# Patient Record
Sex: Female | Born: 1970 | Hispanic: No | Marital: Married | Smoking: Never smoker
Health system: Southern US, Community
[De-identification: ages and names within clinical notes are randomized; demographics above are authoritative.]

## PROBLEM LIST (undated history)

## (undated) HISTORY — PX: OTHER SURGICAL HISTORY: SHX169

---

## 1998-02-17 ENCOUNTER — Encounter: Admission: RE | Admit: 1998-02-17 | Discharge: 1998-02-17 | Payer: Self-pay | Admitting: Obstetrics

## 1998-04-15 ENCOUNTER — Ambulatory Visit (HOSPITAL_COMMUNITY): Admission: RE | Admit: 1998-04-15 | Discharge: 1998-04-15 | Payer: Self-pay | Admitting: Obstetrics & Gynecology

## 1998-04-15 ENCOUNTER — Encounter: Admission: RE | Admit: 1998-04-15 | Discharge: 1998-04-15 | Payer: Self-pay | Admitting: Obstetrics & Gynecology

## 1998-04-15 ENCOUNTER — Other Ambulatory Visit: Admission: RE | Admit: 1998-04-15 | Discharge: 1998-04-15 | Payer: Self-pay | Admitting: Obstetrics & Gynecology

## 1998-04-29 ENCOUNTER — Encounter: Admission: RE | Admit: 1998-04-29 | Discharge: 1998-04-29 | Payer: Self-pay | Admitting: Obstetrics & Gynecology

## 1998-11-03 ENCOUNTER — Ambulatory Visit (HOSPITAL_COMMUNITY): Admission: RE | Admit: 1998-11-03 | Discharge: 1998-11-03 | Payer: Self-pay | Admitting: Obstetrics

## 1999-03-06 ENCOUNTER — Inpatient Hospital Stay (HOSPITAL_COMMUNITY): Admission: AD | Admit: 1999-03-06 | Discharge: 1999-03-08 | Payer: Self-pay | Admitting: *Deleted

## 2000-08-25 ENCOUNTER — Emergency Department (HOSPITAL_COMMUNITY): Admission: EM | Admit: 2000-08-25 | Discharge: 2000-08-26 | Payer: Self-pay | Admitting: Emergency Medicine

## 2000-08-26 ENCOUNTER — Encounter: Payer: Self-pay | Admitting: Emergency Medicine

## 2000-11-11 ENCOUNTER — Emergency Department (HOSPITAL_COMMUNITY): Admission: EM | Admit: 2000-11-11 | Discharge: 2000-11-11 | Payer: Self-pay | Admitting: Emergency Medicine

## 2001-01-21 ENCOUNTER — Ambulatory Visit (HOSPITAL_COMMUNITY): Admission: RE | Admit: 2001-01-21 | Discharge: 2001-01-21 | Payer: Self-pay | Admitting: *Deleted

## 2001-02-26 ENCOUNTER — Inpatient Hospital Stay (HOSPITAL_COMMUNITY): Admission: AD | Admit: 2001-02-26 | Discharge: 2001-02-26 | Payer: Self-pay | Admitting: Obstetrics

## 2001-02-28 ENCOUNTER — Inpatient Hospital Stay (HOSPITAL_COMMUNITY): Admission: AD | Admit: 2001-02-28 | Discharge: 2001-02-28 | Payer: Self-pay | Admitting: *Deleted

## 2001-04-12 ENCOUNTER — Inpatient Hospital Stay (HOSPITAL_COMMUNITY): Admission: AD | Admit: 2001-04-12 | Discharge: 2001-04-14 | Payer: Self-pay | Admitting: Obstetrics

## 2001-07-21 ENCOUNTER — Emergency Department (HOSPITAL_COMMUNITY): Admission: EM | Admit: 2001-07-21 | Discharge: 2001-07-21 | Payer: Self-pay | Admitting: Emergency Medicine

## 2001-07-28 ENCOUNTER — Inpatient Hospital Stay (HOSPITAL_COMMUNITY): Admission: AD | Admit: 2001-07-28 | Discharge: 2001-07-28 | Payer: Self-pay | Admitting: Obstetrics

## 2001-09-09 ENCOUNTER — Emergency Department (HOSPITAL_COMMUNITY): Admission: EM | Admit: 2001-09-09 | Discharge: 2001-09-09 | Payer: Self-pay | Admitting: Emergency Medicine

## 2001-09-10 ENCOUNTER — Encounter: Payer: Self-pay | Admitting: Emergency Medicine

## 2002-12-20 ENCOUNTER — Emergency Department (HOSPITAL_COMMUNITY): Admission: EM | Admit: 2002-12-20 | Discharge: 2002-12-20 | Payer: Self-pay | Admitting: Unknown Physician Specialty

## 2013-07-27 ENCOUNTER — Emergency Department (HOSPITAL_COMMUNITY)
Admission: EM | Admit: 2013-07-27 | Discharge: 2013-07-27 | Disposition: A | Discharge status: Home / Self Care | Payer: Self-pay | Attending: Emergency Medicine | Admitting: Emergency Medicine

## 2013-07-27 ENCOUNTER — Encounter (HOSPITAL_COMMUNITY): Payer: Self-pay | Admitting: Emergency Medicine

## 2013-07-27 DIAGNOSIS — R7401 Elevation of levels of liver transaminase levels: Secondary | ICD-10-CM | POA: Insufficient documentation

## 2013-07-27 DIAGNOSIS — R7402 Elevation of levels of lactic acid dehydrogenase (LDH): Secondary | ICD-10-CM | POA: Insufficient documentation

## 2013-07-27 DIAGNOSIS — R519 Headache, unspecified: Secondary | ICD-10-CM

## 2013-07-27 DIAGNOSIS — R740 Nonspecific elevation of levels of transaminase and lactic acid dehydrogenase [LDH]: Secondary | ICD-10-CM

## 2013-07-27 DIAGNOSIS — R42 Dizziness and giddiness: Secondary | ICD-10-CM | POA: Insufficient documentation

## 2013-07-27 DIAGNOSIS — D649 Anemia, unspecified: Secondary | ICD-10-CM | POA: Insufficient documentation

## 2013-07-27 DIAGNOSIS — R1110 Vomiting, unspecified: Secondary | ICD-10-CM | POA: Insufficient documentation

## 2013-07-27 DIAGNOSIS — H81399 Other peripheral vertigo, unspecified ear: Secondary | ICD-10-CM

## 2013-07-27 DIAGNOSIS — R7309 Other abnormal glucose: Secondary | ICD-10-CM | POA: Insufficient documentation

## 2013-07-27 DIAGNOSIS — R51 Headache: Secondary | ICD-10-CM | POA: Insufficient documentation

## 2013-07-27 DIAGNOSIS — R739 Hyperglycemia, unspecified: Secondary | ICD-10-CM

## 2013-07-27 LAB — CBC WITH DIFFERENTIAL/PLATELET
Basophils Absolute: 0 10*3/uL (ref 0.0–0.1)
Basophils Relative: 0 % (ref 0–1)
Eosinophils Absolute: 0 10*3/uL (ref 0.0–0.7)
Eosinophils Relative: 0 % (ref 0–5)
HCT: 31.9 % — ABNORMAL LOW (ref 36.0–46.0)
HEMOGLOBIN: 10.7 g/dL — AB (ref 12.0–15.0)
LYMPHS ABS: 1.3 10*3/uL (ref 0.7–4.0)
LYMPHS PCT: 23 % (ref 12–46)
MCH: 27.9 pg (ref 26.0–34.0)
MCHC: 33.5 g/dL (ref 30.0–36.0)
MCV: 83.1 fL (ref 78.0–100.0)
MONOS PCT: 3 % (ref 3–12)
Monocytes Absolute: 0.2 10*3/uL (ref 0.1–1.0)
NEUTROS ABS: 4.2 10*3/uL (ref 1.7–7.7)
NEUTROS PCT: 74 % (ref 43–77)
PLATELETS: 218 10*3/uL (ref 150–400)
RBC: 3.84 MIL/uL — AB (ref 3.87–5.11)
RDW: 14.1 % (ref 11.5–15.5)
WBC: 5.7 10*3/uL (ref 4.0–10.5)

## 2013-07-27 LAB — COMPREHENSIVE METABOLIC PANEL
ALT: 55 U/L — AB (ref 0–35)
AST: 63 U/L — ABNORMAL HIGH (ref 0–37)
Albumin: 3.6 g/dL (ref 3.5–5.2)
Alkaline Phosphatase: 44 U/L (ref 39–117)
BUN: 15 mg/dL (ref 6–23)
CHLORIDE: 101 meq/L (ref 96–112)
CO2: 26 mEq/L (ref 19–32)
Calcium: 8.6 mg/dL (ref 8.4–10.5)
Creatinine, Ser: 0.73 mg/dL (ref 0.50–1.10)
GFR calc Af Amer: >90 mL/min (ref >90)
GFR calc non Af Amer: >90 mL/min (ref >90)
GLUCOSE: 145 mg/dL — AB (ref 70–99)
POTASSIUM: 4 meq/L (ref 3.7–5.3)
Sodium: 139 mEq/L (ref 137–147)
TOTAL PROTEIN: 7.5 g/dL (ref 6.0–8.3)

## 2013-07-27 LAB — URINALYSIS, ROUTINE W REFLEX MICROSCOPIC
Bilirubin Urine: NEGATIVE
Glucose, UA: NEGATIVE mg/dL
Hgb urine dipstick: NEGATIVE
Ketones, ur: NEGATIVE mg/dL
LEUKOCYTES UA: NEGATIVE
NITRITE: NEGATIVE
PH: 6 (ref 5.0–8.0)
Protein, ur: NEGATIVE mg/dL
SPECIFIC GRAVITY, URINE: 1.017 (ref 1.005–1.030)
UROBILINOGEN UA: 0.2 mg/dL (ref 0.0–1.0)

## 2013-07-27 MED ORDER — ONDANSETRON HCL 4 MG/2ML IJ SOLN
4.0000 mg | Freq: Once | INTRAMUSCULAR | Status: AC
  Administered 2013-07-27: 4 mg via INTRAVENOUS
  Filled 2013-07-27: qty 2

## 2013-07-27 MED ORDER — MECLIZINE HCL 25 MG PO TABS
25.0000 mg | ORAL_TABLET | Freq: Once | ORAL | Status: AC
  Administered 2013-07-27: 25 mg via ORAL
  Filled 2013-07-27: qty 1

## 2013-07-27 MED ORDER — METOCLOPRAMIDE HCL 10 MG PO TABS: 10.0000 mg | ORAL_TABLET | Freq: Four times a day (QID) | ORAL | Status: DC | PRN

## 2013-07-27 MED ORDER — MECLIZINE HCL 25 MG PO TABS: 25.0000 mg | ORAL_TABLET | Freq: Three times a day (TID) | ORAL | Status: DC | PRN

## 2013-07-27 NOTE — ED Provider Notes (Signed)
CSN: [id number]     Arrival date & time 07/27/13  0051 History   First MD Initiated Contact with Patient 07/27/13 0421     Chief Complaint  Patient presents with  . Emesis  . Dizziness   (Consider location/radiation/quality/duration/timing/severity/associated sxs/prior Treatment) Patient is a 43 y.o. female presenting with vomiting and dizziness. The history is provided by the patient.  Emesis Dizziness Associated symptoms: vomiting   She had onset at 11:30 PM of vertigo with associated nausea and vomiting. Vertigo was worse when she moves her head such as turning over in bed. She denies fever, chills, sweats. She has noted some mild tinnitus but no hearing loss. She was treated for influenza last week and did receive a five-day course of Tamiflu. The symptoms are generally improved. She's never had symptoms like this before.  History reviewed. No pertinent past medical history. History reviewed. No pertinent past surgical history. No family history on file. History  Substance Use Topics  . Smoking status: Never Smoker   . Smokeless tobacco: Not on file  . Alcohol Use: No   OB History   Grav Para Term Preterm Abortions TAB SAB Ect Mult Living                 Review of Systems  Gastrointestinal: Positive for vomiting.  Neurological: Positive for dizziness.  All other systems reviewed and are negative.    Allergies  Review of patient's allergies indicates no known allergies.  Home Medications  No current outpatient prescriptions on file. BP 101/63  Pulse 62  Temp(Src) 97.7 F (36.5 C) (Oral)  Resp 22  SpO2 99%  LMP 07/20/2013 Physical Exam  Nursing note and vitals reviewed.  43 year old female, resting comfortably and in no acute distress. Vital signs are significant for bradycardia with heart rate 54, and tachypnea with respiratory rate of 22. Oxygen saturation is 98%, which is normal. Head is normocephalic and atraumatic. PERRLA, EOMI. Oropharynx is clear. No  nystagmus is seen. Neck is nontender and supple without adenopathy or JVD. Back is nontender and there is no CVA tenderness. Lungs are clear without rales, wheezes, or rhonchi. Chest is nontender. Heart has regular rate and rhythm without murmur. Abdomen is soft, flat, nontender without masses or hepatosplenomegaly and peristalsis is normoactive. Extremities have no cyanosis or edema, full range of motion is present. Skin is warm and dry without rash. Neurologic: Mental status is normal, cranial nerves are intact, there are no motor or sensory deficits. Dizziness is reproduced by head movement.  ED Course  Procedures (including critical care time) Labs Review Results for orders placed during the hospital encounter of 07/27/13  URINALYSIS, ROUTINE W REFLEX MICROSCOPIC      Result Value Range   Color, Urine YELLOW  YELLOW   APPearance HAZY (*) CLEAR   Specific Gravity, Urine 1.017  1.005 - 1.030   pH 6.0  5.0 - 8.0   Glucose, UA NEGATIVE  NEGATIVE mg/dL   Hgb urine dipstick NEGATIVE  NEGATIVE   Bilirubin Urine NEGATIVE  NEGATIVE   Ketones, ur NEGATIVE  NEGATIVE mg/dL   Protein, ur NEGATIVE  NEGATIVE mg/dL   Urobilinogen, UA 0.2  0.0 - 1.0 mg/dL   Nitrite NEGATIVE  NEGATIVE   Leukocytes, UA NEGATIVE  NEGATIVE  CBC WITH DIFFERENTIAL      Result Value Range   WBC 5.7  4.0 - 10.5 K/uL   RBC 3.84 (*) 3.87 - 5.11 MIL/uL   Hemoglobin 10.7 (*) 12.0 - 15.0 g/dL  HCT 31.9 (*) 36.0 - 46.0 %   MCV 83.1  78.0 - 100.0 fL   MCH 27.9  26.0 - 34.0 pg   MCHC 33.5  30.0 - 36.0 g/dL   RDW 40.9  81.1 - 91.4 %   Platelets 218  150 - 400 K/uL   Neutrophils Relative % 74  43 - 77 %   Neutro Abs 4.2  1.7 - 7.7 K/uL   Lymphocytes Relative 23  12 - 46 %   Lymphs Abs 1.3  0.7 - 4.0 K/uL   Monocytes Relative 3  3 - 12 %   Monocytes Absolute 0.2  0.1 - 1.0 K/uL   Eosinophils Relative 0  0 - 5 %   Eosinophils Absolute 0.0  0.0 - 0.7 K/uL   Basophils Relative 0  0 - 1 %   Basophils Absolute 0.0  0.0  - 0.1 K/uL  COMPREHENSIVE METABOLIC PANEL      Result Value Range   Sodium 139  137 - 147 mEq/L   Potassium 4.0  3.7 - 5.3 mEq/L   Chloride 101  96 - 112 mEq/L   CO2 26  19 - 32 mEq/L   Glucose, Bld 145 (*) 70 - 99 mg/dL   BUN 15  6 - 23 mg/dL   Creatinine, Ser 7.82  0.50 - 1.10 mg/dL   Calcium 8.6  8.4 - 95.6 mg/dL   Total Protein 7.5  6.0 - 8.3 g/dL   Albumin 3.6  3.5 - 5.2 g/dL   AST 63 (*) 0 - 37 U/L   ALT 55 (*) 0 - 35 U/L   Alkaline Phosphatase 44  39 - 117 U/L   Total Bilirubin <0.2 (*) 0.3 - 1.2 mg/dL   GFR calc non Af Amer >90  >90 mL/min   GFR calc Af Amer >90  >90 mL/min   MDM   1. Peripheral vertigo, unspecified laterality   2. Anemia   3. Elevated transaminase level   4. Hyperglycemia   5. Headache    Acute vertigo. She is given a dose of ondansetron with improvement in nausea she'll be given a dose of meclizine. Laboratory workup is significant for mild anemia, and mild elevation of transaminases. Blood sugar is also borderline elevated at 145. These will need to be followed as an outpatient.   She feels much better after above noted treatment. She is discharged with prescriptions for meclizine and metoclopramide. She is advised to have blood sugar and AST rechecked as an outpatient.   Dione Booze, MD 07/27/13 [phone]

## 2013-07-27 NOTE — ED Notes (Signed)
Pt. reports dizziness with nausea / vomitting , body aches / chills  onset this evening , denies  diarrhea ,  No fever or cough .

## 2013-07-27 NOTE — Discharge Instructions (Signed)
Su azcar en la sangre era alto hoy. Eso podra ser una seal temprana de la diabetes. Debes revisar peridicamente su azcar en la sangre.  Algunos anlisis de sangre de hgado eran altos hoy - AST y ALT. Esto deben ser revisados en 1-2 semanas.  Vrtigo (Vertigo)  Vrtigo es la sensacin de que se est moviendo estando quieto. Puede ser peligroso si ocurre cuando est trabajado, conduciendo vehculos o realizando actividades difciles.  CAUSAS  El vrtigo se produce cuando hay un conflicto en las seales que se envan al cerebro desde los sistemas visual y sensorial del cuerpo. Hay numerosas causas que [employer], entre las que se incluyen:   Infecciones, especialmente en el odo interno.  Nelia Shi reaccin a un medicamento o mal uso de alcohol y frmacos.  Abstinencia de drogas o alcohol.  Cambios rpidos de posicin, como al [employer] o darse vuelta en la cama.  Dolor de Surveyor, minerals.  Disminucin del flujo sanguneo hacia el cerebro.  Aumento de la presin en el cerebro por un traumatismo, infeccin, tumor o sangrado en la cabeza. SNTOMAS  Puede sentir como si el mundo da vueltas o va a caer al piso. Como hay problemas en el equilibrio, el vrtigo puede causar nuseas y vmitos. Tiene movimientos oculares involuntarios (nistagmus).  DIAGNSTICO  El vrtigo normalmente se diagnostica con un examen fsico. Si la causa no se conoce, el mdico puede indicar diagnstico por imgenes, como una resonancia magntica (imgenes por Health visitor).  TRATAMIENTO  La mayor parte de los casos de vrtigo se resuelve sin TEFL teacher. Segn la causa, el mdico podr recetar ciertos medicamentos. Si se relaciona con la posicin del cuerpo, podr recomendarle movimientos o procedimientos para corregir el problema. En algunos casos raros, si la causa del vrtigo es un problema en el odo interno, necesitar [place].  INSTRUCCIONES PARA EL CUIDADO DOMICILIARIO  Siga las  indicaciones del mdico.  Evite conducir vehculos.  Evite operar maquinarias pesadas.  Evite realizar tareas que seran peligrosas para usted u otras personas durante un episodio de vrtigo.  Comunquele al mdico si nota que ciertos medicamentos parecen asociarse con las crisis. Algunos medicamentos que se usan para tratar los episodios, en [employer]. SOLICITE ATENCIN MDICA DE INMEDIATO SI:  Los medicamentos no [place] las crisis o hacen que estas empeoren.  Tiene dificultad para hablar, caminar, siente debilidad o tiene problemas para [employer], las manos o las piernas.  Comienza a sufrir un dolor de cabeza intenso.  Las nuseas y los vmitos no se [place] o se Press photographer.  Aparecen trastornos visuales.  Un miembro de su familia nota cambios en su conducta.  Hay alguna modificacin en su trastorno que parece Holiday representative de Scientist, clinical (histocompatibility and immunogenetics). ASEGRESE DE QUE:   Comprende estas instrucciones.  Controlar su enfermedad.  Solicitar ayuda de inmediato si no mejora o si empeora. Document Released: 04/11/2005 Document Revised: 09/24/2011 [facility] Patient Information 2014 [place], [place].  Meclizine tablets or capsules Qu es este medicamento? La MECLIZINA es un antihistamnico. Este medicamento se [place] para prevenir nuseas, vmito o sensacin de [employer] asociados con los mareos provocados por el movimiento. Tambin se [place] para tratar o prevenir el vrtigo (mareo intenso o sensacin de que usted o su entorno se mueven o giran). Este medicamento puede ser utilizado para otros usos; si tiene alguna pregunta consulte con su proveedor de atencin mdica o con su farmacutico. MARCAS COMERCIALES DISPONIBLES: Antivert, Dramamine Less Drowsy, Medivert, Meni-D  Qu le debo informar a  mi profesional de la salud antes de tomar este medicamento? Necesita saber si usted presenta alguno de los siguientes problemas o  situaciones: -asma -glaucoma -problemas de prstata -problemas estomacales -problemas urinarios -una reaccin alrgica o inusual a la meclizina, a otros medicamentos, alimentos, colorantes o conservantes -si est embarazada o buscando quedar embarazada -si est amamantando a un beb Cmo debo utilizar este medicamento? Tome este medicamento por va oral con un vaso de agua. Siga las instrucciones de la etiqueta del [place]. Si est tomando este medicamento para evitar los mareos por el movimiento, tome la dosis por lo menos 1 hora antes de Tourist information centre manager. Si el Social worker, tmelo con alimentos o con [place]. Tome sus dosis a intervalos regulares. No tome su medicamento con una frecuencia mayor a la indicada. Hable con su pediatra para informarse acerca del uso de este medicamento en nios. Puede requerir atencin especial. Sobredosis: Pngase en contacto inmediatamente con un centro toxicolgico o una sala de urgencia si usted cree que haya tomado demasiado medicamento. ATENCIN: [employer] es solo para usted. No comparta este medicamento con nadie. Qu sucede si me olvido de una dosis? Si olvida una dosis, tmela lo antes posible. Si es casi la hora de la prxima dosis, tome slo esa dosis. No tome dosis adicionales o dobles. Qu puede interactuar con este medicamento? -barbitricos para inducir el sueo o para el tratamiento de convulsiones -digoxina -medicamentos para la ansiedad o para problemas para conciliar el sueo, tales como el alprazolam, diazepam o temazepam -medicamentos para la fiebre del heno y Education officer, environmental -medicamentos para la depresin mental -medicamentos para movimientos anormales, como los causados por la enfermedad de Occupational hygienist, o para problemas estomacales -analgsicos -medicamentos para International aid/development worker los msculos Puede ser que esta lista no menciona todas las posibles interacciones. Informe a su profesional de [employer] de [employer]  productos a base de hierbas, medicamentos de [place] o suplementos nutritivos que est tomando. Si usted fuma, consume bebidas alcohlicas o si utiliza drogas ilegales, indqueselo tambin a su profesional de [employer]. Algunas sustancias pueden interactuar con su medicamento. A qu debo estar atento al usar [employer]? Si est tomando [employer] forma regular, debe visitar a su mdico o a su profesional de la salud para chequear su evolucin peridicamente. Puede experimentar mareos, somnolencia o visin borrosa. No conduzca ni utilice maquinaria, ni haga nada que Scientist, research (life sciences) en estado de alerta hasta que sepa cmo le afecta este medicamento. No se siente ni se ponga de pie con rapidez, especialmente si es un paciente de edad avanzada. Esto reduce el riesgo de mareos o [employer]. El alcohol puede aumentar los [place]. Evite consumir bebidas alcohlicas. Se le podr secar la boca. Masticar chicle sin azcar, chupar caramelos duros y tomar agua en abundancia le ayudar a mantener la boca hmeda. Si el problema no desaparece o es severo, consulte a su mdico. Este medicamento puede resecarle los ojos y provocar visin borrosa. Si [place] lentes de contacto, puede sentir ciertas molestias. Las gotas lubricantes pueden ayudarle. Si el problema no desaparece o es severo, consulte a su mdico. Qu efectos secundarios puedo tener al [employer] este medicamento? Efectos secundarios que debe informar a su mdico o a Producer, television/film/video de la salud tan pronto como sea posible: -desmayos -pulso cardiaco rpido o irregular Efectos secundarios que, por lo general, no requieren atencin mdica (debe informarlos a su mdico o a su profesional de la salud si persisten o si son molestos): -estreimiento -dificultad para  orinar -dificultad para dormir -dolor de cabeza -Programme researcher, broadcasting/film/videomalestar estomacal Puede ser que esta lista no menciona todos los posibles efectos secundarios. Comunquese a su mdico por  asesoramiento mdico Hewlett-Packardsobre los efectos secundarios. Usted puede informar los efectos secundarios a la FDA por telfono al 1-800-FDA-1088. Dnde debo guardar mi medicina? Mantngala fuera del alcance de los nios. Gurdela a Sanmina-SCItemperatura ambiente, entre 15 y 30 grados C ([phone] y [phone] grados F). Mantenga el envase bien cerrado. Deseche los medicamentos que no haya utilizado, despus de la fecha de vencimiento. ATENCIN: Este folleto es un resumen. Puede ser que no cubra toda la posible informacin. Si usted tiene preguntas acerca de esta medicina, consulte con su mdico, su farmacutico o su profesional de Radiographer, therapeuticla salud.  2014, Elsevier/Gold Standard. (2004-09-28 10:35:00)  Metoclopramide tablets Qu es este medicamento? La METOCLOPRAMIDA se Ladonna Snideutiliza para tratar los sntomas de la enfermedad del reflujo gastroesofgico (ERGE) como el acidez estomacal. Tambin se [place] Islandsutiliza para tratar pacientes con capacidad lenta para vaciar el estmago y el tracto intestinal. Este medicamento puede ser utilizado para otros usos; si tiene alguna pregunta consulte con su proveedor de atencin mdica o con su farmacutico. MARCAS COMERCIALES DISPONIBLES: Reglan Qu le debo informar a mi profesional de la salud antes de tomar este medicamento? Necesita saber si usted presenta alguno de los Coventry Health Caresiguientes problemas o situaciones: -cncer de mama -depresin -diabetes -insuficiencia cardiaca -alta presin sangunea -enfermedad renal -enfermedad heptica -enfermedad de Parkinson o un trastorno de movimiento -feocromocitoma -convulsiones -obstruccin, sangrado o perforacin estomacal -una reaccin alrgica o inusual a la metoclopramida, procainamida, sulfitos, a otros medicamentos, alimentos, colorantes o conservantes -si est embarazada o buscando quedar embarazada -si est amamantando a un beb Cmo debo utilizar este medicamento? Tome este medicamento por va oral con un vaso de agua. Siga las instrucciones de la etiqueta  del [place]medicamento. Tome este medicamento con el estmago vaco, por lo menos 30 minutos antes de comer. Tome sus dosis a intervalos regulares. No tome su medicamento con una frecuencia mayor a la indicada. No deje de tomarlo excepto si as lo indica su mdico o su profesional de Beazer Homesla salud. Su farmacutico le dar una [place] del medicamento especial con cada receta y relleno. Asegrese de leer esta informacin cada vez cuidadosamente. Hable con su pediatra para informarse acerca del uso de este medicamento en nios. Puede requerir atencin especial. Sobredosis: Pngase en contacto inmediatamente con un centro toxicolgico o una sala de urgencia si usted cree que haya tomado demasiado medicamento. ATENCIN: Reynolds AmericanEste medicamento es solo para usted. No comparta este medicamento con nadie. Qu sucede si me olvido de una dosis? Si olvida una dosis, tmela lo antes posible. Si es casi la hora de la prxima dosis, tome slo esa dosis. No tome dosis adicionales o dobles. Qu puede interactuar con este medicamento? -acetaminofeno -ciclosporina -digoxina -medicamentos para la presin sangunea  -medicamentos para la diabetes, incluso la insulina -medicamentos para la fiebre del heno y Education officer, environmentalotras alergias -medicamentos para la depresin, especialmente un inhibidor de monoamina oxidasa (IMAO) -medicamentos para la enfermedad de Parkinson, como levodopa -analgsicos o medicamentos para dormir -Tax advisertetraciclina Puede ser que esta lista no menciona todas las posibles interacciones. Informe a su profesional de Beazer Homesla salud de Ingram Micro Inctodos los productos a base de hierbas, medicamentos de [place] libre o suplementos nutritivos que est tomando. Si usted fuma, consume bebidas alcohlicas o si utiliza drogas ilegales, indqueselo tambin a su profesional de Beazer Homesla salud. Algunas sustancias pueden interactuar con su medicamento. A qu debo estar atento al usar PPL Corporationeste medicamento? Es  posible que transcurran algunas semanas para que su problema  estomacal mejore. Sin embargo, no tome este medicamento durante ms de 12 semanas. Cuanto ms tiempo tome [employer], y cuanto ms se toma, mayor son sus posibilidades de Pensions consultant secundarios graves. Si es un paciente de edad [place], una mujer o si tiene diabetes podr Warehouse manager un mayor riesgo de experimentar efectos secundarios de [employer]. Comunquese con su mdico inmediatamente si empieza a experimentar movimientos que no puede controlar como, chasquido de labios, movimientos rpidos de [address], movimientos incontrolables o involuntarios de los ojos, [place], brazos y piernas o espasmos musculares. Los pacientes y sus familias deben estar atentos si empeora la depresin o ideas suicidas. Tambin est atento a cambios repentinos o severos de emocin, tales como el sentirse ansioso, agitado, lleno de pnico, irritable, hostil, agresivo, impulsivo, inquietud severa, demasiado excitado y hiperactivo o dificultad para conciliar el sueo. Si esto ocurre, especialmente al comenzar con el tratamiento o al cambiar de dosis, comunquese con su mdico. No se trate usted mismo para fiebre alta. Consulte a su mdico o su profesional de la salud por asesoramiento. Puede experimentar mareos o somnolencia. No conduzca ni utilice maquinaria ni haga nada que Scientist, research (life sciences) en estado de alerta hasta que sepa cmo le afecta este medicamento. No se siente ni se ponga de pie con rapidez, especialmente si es un paciente de edad avanzada. Esto reduce el riesgo de mareos o [employer]. El alcohol puede aumentar los mareos y la somnolencia. Evite consumir bebidas alcohlicas. Qu efectos secundarios puedo tener al [employer] este medicamento? Efectos secundarios que debe informar a su mdico o a Producer, television/film/video de la salud tan pronto como sea posible: -Therapist, art como erupcin cutnea, picazn o urticarias, hinchazn de la cara, labios o lengua -produccin de [employer] anormal en  mujeres -agrandamiento de las tetillas (hombres) o los pechos (mujeres) -cambio en la manera de caminar -dificultad para moverse, hablar o tragar -babeo, relamerse los labios o movimientos rpidos de la lengua -sudoracin excesiva  -fiebre -movimientos involuntarios o incontrolables de ojos, cabeza, brazos y piernas -latidos cardiacos irregulares o palpitaciones -espasmos o tics nerviosos musculares -cansancio o debilidad inusual Efectos secundarios que, por lo general, no requieren atencin mdica (debe informarlos a su mdico o a su profesional de la salud si persisten o si son molestos): -cambios en el deseo sexual o capacidad -humor deprimido -diarrea -dificultad para dormir -dolor de cabeza -cambios en la menstruacin -inquietud o nerviosismo Puede ser que esta lista no menciona todos los posibles efectos secundarios. Comunquese a su mdico por asesoramiento mdico [employer]lett[employer]ard. Usted puede informar los efectos secundarios a la FDA por telfono al 1-800-FDA-1088. Dnde debo guardar mi medicina? Mantngala fuera del alcance de los nios. Gurdela a Sanmina-SCI, entre 20 y 25 grados C (30 y 19 grados F). Protjala de la luz. Mantenga el envase bien cerrado. Deseche los medicamentos que no haya utilizado, despus de la fecha de vencimiento. ATENCIN: Este folleto es un resumen. Puede ser que no cubra toda la posible informacin. Si usted tiene preguntas acerca de esta medicina, consulte con su mdico, su farmacutico o su profesional de Radiographer, therapeutic.  2014, Elsevier/Gold Standard. (2011-11-09 16:01:45)

## 2013-08-25 ENCOUNTER — Ambulatory Visit: Payer: Self-pay | Admitting: Emergency Medicine

## 2013-08-25 VITALS — BP 118/76 | HR 84 | Temp 98.1°F | Resp 16 | Ht 66.75 in | Wt 164.4 lb

## 2013-08-25 DIAGNOSIS — J209 Acute bronchitis, unspecified: Secondary | ICD-10-CM

## 2013-08-25 DIAGNOSIS — J018 Other acute sinusitis: Secondary | ICD-10-CM

## 2013-08-25 MED ORDER — PSEUDOEPHEDRINE-GUAIFENESIN ER 60-600 MG PO TB12: 1.0000 | ORAL_TABLET | Freq: Two times a day (BID) | ORAL | Status: DC

## 2013-08-25 MED ORDER — AMOXICILLIN-POT CLAVULANATE 875-125 MG PO TABS: 1.0000 | ORAL_TABLET | Freq: Two times a day (BID) | ORAL | Status: DC

## 2013-08-25 MED ORDER — GUAIFENESIN-DM 100-10 MG/5ML PO SYRP: 5.0000 mL | ORAL_SOLUTION | ORAL | Status: DC | PRN

## 2013-08-25 NOTE — Patient Instructions (Signed)
Sinusitis   (Sinusitis)  La sinusitis es el enrojecimiento, sensibilidad e hinchazón (inflamación) de los senos paranasales. Los senos paranasales son bolsas de aire que se encuentran dentro de los huesos de la cara (por debajo de los ojos, en la mitad de la frente o por encima de los ojos). En los senos paranasales sanos, el moco puede drenar y el aire circula a través de ellos en su camino hacia la nariz. Sin embargo, cuando se inflaman, el moco y el aire quedan atrapados. Esto hace que se desarrollen bacterias y otros gérmenes y originen una infección.    La sinusitis puede desarrollarse rápidamente y durar sólo un tiempo corto (aguda) o continuar por un período largo (crónica). La sinusitis que dura más de 12 semanas se considera crónica.   CAUSAS   Las causas de la sinusitis son:   · Alergias  · Las anomalías estructurales, como el desplazamiento del cartílago que separa las fosas nasales (desvío del tabique) pueden disminuir el flujo de aire por la nariz y los senos paranasales y afectar su drenaje.  · Las alteraciones funcionales, como cuando los pequeños pelos (cilias) que se encuentran en los senos nasales y que ayudan a eliminar la mucosidad no funcionan correctamente o no están presentes.  SÍNTOMAS   Los síntomas de sinusitis aguda y crónica son los mismos. Los síntomas principales son el dolor y la presión alrededor de los senos paranasales afectados. Otros síntomas son:   · Dolor en los dientes superiores.  · Dolor de oídos.  · Dolor de cabeza.  · Mal aliento.  · Disminución del sentido del olfato y del gusto.  · Tos, que empeora al acostarse.  · Fatiga.  · Fiebre.  · Drenaje de moco espeso por la nariz, que generalmente es de color verde y puede contener pus (purulento).  · Hinchazón y calor en los senos paranasales afectados.  DIAGNÓSTICO   El médico le hará un examen físico. Durante el examen, el médico:   · Revisará su nariz buscando signos de crecimientos anormales en las fosas nasales (pólipos  nasales).  · Palpará los senos paranasales afectados para buscar signos de infección.  · Observará el interior de los senos paranasales (endoscopía) con un dispositivo especial que emite luz (endoscopio) colocándolo dentro de los senos paranasales.  Si el médico sospecha que usted sufre sinusitis crónica, podrá indicar una o más de las siguientes pruebas:   · Pruebas de alergia.  · Cultivo de secreciones nasales: tomará una muestra del moco nasal y la enviará a un laboratorio para detectar bacterias.  · Citología nasal: el médico tomará una muestra de moco de la nariz para determinar si la sinusitis que usted sufre está relacionada con una alergia.  TRATAMIENTO   La mayoría de los casos de sinusitis aguda se deben a una infección viral y se resuelven espontáneamente dentro de los 10 días. En algunos casos se recetan medicamentos para aliviar los síntomas (analgésicos, descongestivos, aerosoles nasales con corticoides o aerosoles salinos).   Sin embargo, para la sinusitis por infección bacteriana, el médico le recetará antibióticos. Los antibióticos son medicamentos que destruyen las bacterias que causan la infección.   Rara vez la sinusitis tiene su origen en una infección por hongos. En estos casos, el médico le recetará un medicamento antifúngico.   Para algunos casos de sinusitis crónica, es necesario someterse a una cirugía. Generalmente se trata de casos en los que la sinusitis se repite más de 3 veces al año, a pesar de otros tratamientos.     INSTRUCCIONES PARA EL CUIDADO EN EL HOGAR   · Tiene que beber gran cantidad de agua. Los líquidos ayudan a disolver el moco para que drene más fácilmente de los senos paranasales.  · Use un humidificador.  · Inhale vapor de 3 a 4 veces al día (por ejemplo, siéntese en el baño con la ducha abierta).  · Aplique un paño tibio y húmedo en su cara 3 ó 4 veces al día, o según las indicaciones de su médico.  · Use un aerosol nasal salino para ayudar a humedecer y limpiar los  senos nasales.  · Tome medicamentos de venta libre o recetados para el aliviar el dolor, el malestar o la fiebre sólo según las indicaciones de su médico.  SOLICITE ATENCIÓN MÉDICA DE INMEDIATO SI:   · Siente más dolor o sufre dolores de cabeza intensos.  · Tiene náuseas, vómitos o somnolencia.  · Observa hinchazón alrededor del rostro.  · Tiene problemas de visión.  · Presenta rigidez en el cuello.  · Tiene dificultad para respirar.  ASEGÚRESE DE QUE:   · Comprende estas instrucciones.  · Controlará su enfermedad.  · Recibirá ayuda de inmediato si no mejora o si empeora.  Document Released: 04/11/2005 Document Revised: 03/04/2013  ExitCare® Patient Information ©2014 ExitCare, LLC.

## 2013-08-25 NOTE — Progress Notes (Signed)
[facility] [address], [place] [id number] [phone]  Date:  08/25/2013   Name:  Cathy [name]   DOB:  [birth year]   MRN:  [id number]  PCP:  No PCP Per Patient    Chief Complaint: Cough and Sore Throat   History of Present Illness:  Cathy [name] is a 43 y.o. very pleasant female patient who presents with the following:  Ill since first of the year with nasal congestion, drainage and a cough.  Has mucopurulent drainage and sputum.  No fever and chills, wheezing or shortness of breath.  Extensive [facility] of [facility] to [facility].  Treated with amoxicillin with no improvement.  No nausea or vomiting or stool change.  No improvement with over the counter medications or other home remedies. Denies other complaint or health concern today.   There are no active problems to display for this patient.   No past medical history on file.  No past surgical history on file.  History  Substance Use Topics  . Smoking status: Never Smoker   . Smokeless tobacco: Not on file  . Alcohol Use: No    No family history on file.  No Known Allergies  Medication list has been reviewed and updated.  Current Outpatient Prescriptions on File Prior to Visit  Medication Sig Dispense Refill  . meclizine (ANTIVERT) 25 MG tablet Take 1 tablet (25 mg total) by mouth 3 (three) times daily as needed for dizziness.  30 tablet  0  . metoCLOPramide (REGLAN) 10 MG tablet Take 1 tablet (10 mg total) by mouth every 6 (six) hours as needed for nausea.  30 tablet  0   No current facility-administered medications on file prior to visit.    Review of Systems:  As per HPI, otherwise negative.    Physical Examination: Filed Vitals:   08/25/13 1228  BP: 118/76  Pulse: 84  Temp: 98.1 F (36.7 C)  Resp: 16   Filed Vitals:   08/25/13 1228  Height: 5' 6.75" (1.695 m)  Weight: 164 lb 6.4 oz (74.571 kg)   Body mass index is 25.96 kg/(m^2). Ideal Body Weight:  Weight in (lb) to have BMI = 25: 158.1  GEN: WDWN, NAD, Non-toxic, A & O x 3 HEENT: Atraumatic, Normocephalic. Neck supple. No masses, No LAD. Ears and Nose: No external deformity. CV: RRR, No M/G/R. No JVD. No thrill. No extra heart sounds. PULM: CTA B, no wheezes, crackles, rhonchi. No retractions. No resp. distress. No accessory muscle use. ABD: S, NT, ND, +BS. No rebound. No HSM. EXTR: No c/c/e NEURO Normal gait.  PSYCH: Normally interactive. Conversant. Not depressed or anxious appearing.  Calm demeanor.  36  Assessment and Plan: Sinusitis Bronchitis augmentin mucinex d Robitussin dm   Signed,  Phillips OdorJeffery Essam Lowdermilk, MD

## 2013-09-02 ENCOUNTER — Emergency Department (HOSPITAL_COMMUNITY): Payer: Self-pay

## 2013-09-02 ENCOUNTER — Emergency Department (HOSPITAL_COMMUNITY)
Admission: EM | Admit: 2013-09-02 | Discharge: 2013-09-02 | Disposition: A | Discharge status: Home / Self Care | Payer: Self-pay | Attending: Emergency Medicine | Admitting: Emergency Medicine

## 2013-09-02 ENCOUNTER — Encounter (HOSPITAL_COMMUNITY): Payer: Self-pay | Admitting: Emergency Medicine

## 2013-09-02 DIAGNOSIS — R42 Dizziness and giddiness: Secondary | ICD-10-CM

## 2013-09-02 DIAGNOSIS — R110 Nausea: Secondary | ICD-10-CM | POA: Insufficient documentation

## 2013-09-02 DIAGNOSIS — J329 Chronic sinusitis, unspecified: Secondary | ICD-10-CM | POA: Insufficient documentation

## 2013-09-02 DIAGNOSIS — I69998 Other sequelae following unspecified cerebrovascular disease: Secondary | ICD-10-CM | POA: Insufficient documentation

## 2013-09-02 LAB — BASIC METABOLIC PANEL
BUN: 13 mg/dL (ref 6–23)
CO2: 29 mEq/L (ref 19–32)
Calcium: 9.1 mg/dL (ref 8.4–10.5)
Chloride: 104 mEq/L (ref 96–112)
Creatinine, Ser: 0.81 mg/dL (ref 0.50–1.10)
GFR calc non Af Amer: 87 mL/min — ABNORMAL LOW (ref >90)
Glucose, Bld: 98 mg/dL (ref 70–99)
Potassium: 4.5 mEq/L (ref 3.7–5.3)
Sodium: 142 mEq/L (ref 137–147)

## 2013-09-02 LAB — CBC
HCT: 34.2 % — ABNORMAL LOW (ref 36.0–46.0)
Hemoglobin: 11.4 g/dL — ABNORMAL LOW (ref 12.0–15.0)
MCH: 28.4 pg (ref 26.0–34.0)
MCHC: 33.3 g/dL (ref 30.0–36.0)
MCV: 85.3 fL (ref 78.0–100.0)
PLATELETS: 211 10*3/uL (ref 150–400)
RBC: 4.01 MIL/uL (ref 3.87–5.11)
RDW: 14.8 % (ref 11.5–15.5)
WBC: 5.3 10*3/uL (ref 4.0–10.5)

## 2013-09-02 LAB — HCG, SERUM, QUALITATIVE: PREG SERUM: NEGATIVE

## 2013-09-02 MED ORDER — AMOXICILLIN-POT CLAVULANATE 875-125 MG PO TABS: 1.0000 | ORAL_TABLET | Freq: Two times a day (BID) | ORAL | Status: DC

## 2013-09-02 MED ORDER — MECLIZINE HCL 25 MG PO TABS
25.0000 mg | ORAL_TABLET | Freq: Once | ORAL | Status: AC
  Administered 2013-09-02: 25 mg via ORAL
  Filled 2013-09-02: qty 1

## 2013-09-02 MED ORDER — ONDANSETRON HCL 4 MG/2ML IJ SOLN
4.0000 mg | Freq: Once | INTRAMUSCULAR | Status: AC
  Administered 2013-09-02: 4 mg via INTRAVENOUS
  Filled 2013-09-02: qty 2

## 2013-09-02 MED ORDER — MECLIZINE HCL 50 MG PO TABS: 25.0000 mg | ORAL_TABLET | Freq: Three times a day (TID) | ORAL | Status: DC | PRN

## 2013-09-02 MED ORDER — PROMETHAZINE HCL 25 MG PO TABS: 25.0000 mg | ORAL_TABLET | Freq: Four times a day (QID) | ORAL | Status: DC | PRN

## 2013-09-02 MED ORDER — AMOXICILLIN-POT CLAVULANATE 875-125 MG PO TABS
1.0000 | ORAL_TABLET | Freq: Once | ORAL | Status: AC
  Administered 2013-09-02: 1 via ORAL
  Filled 2013-09-02: qty 1

## 2013-09-02 MED ORDER — DIAZEPAM 5 MG PO TABS
5.0000 mg | ORAL_TABLET | Freq: Once | ORAL | Status: AC
  Administered 2013-09-02: 5 mg via ORAL
  Filled 2013-09-02: qty 1

## 2013-09-02 NOTE — ED Notes (Signed)
Pt reports dizziness has resolved at this time.

## 2013-09-02 NOTE — Discharge Instructions (Signed)
Sinusitis  (Sinusitis) La sinusitis es el enrojecimiento, sensibilidad e hinchazn (inflamacin) de los senos paranasales. Los senos paranasales son bolsas de aire que se encuentran dentro de los huesos de la cara (por debajo de los ojos, en la mitad de la frente o por encima de los ojos). En los senos paranasales sanos, el moco puede drenar y el aire circula a travs de ellos en su camino hacia la Lawyer. Sin embargo, cuando se Doctor Phillips, el moco y el aire [place]. Esto hace que se desarrollen bacterias y otros grmenes y originen una infeccin.   La sinusitis puede desarrollarse rpidamente y durar slo un tiempo corto (aguda) o continuar por un perodo largo (crnica). La sinusitis que dura ms de 12 semanas se considera crnica.  CAUSAS  Las causas de la sinusitis son:   Advertising account executive estructurales, como el desplazamiento del cartlago que separa las fosas nasales (desvo del tabique) pueden disminuir el flujo de aire por la nariz y los senos paranasales y Print production planner su drenaje.  Las alteraciones funcionales, como cuando los pequeos pelos (cilias) que se encuentran en los senos nasales y que ayudan a eliminar la mucosidad no funcionan correctamente o no estn presentes. SNTOMAS  Los sntomas de sinusitis aguda y crnica son los mismos. Los sntomas principales son Conservation officer, historic buildings y la presin alrededor de los senos paranasales afectados. Otros sntomas son:   Chief of Staff.  Dolor de odos.  Dolor de [place].  Mal aliento.  Disminucin del sentido del olfato y del gusto.  Tos, que empeora al [employer].  Fatiga.  Cristy Hilts.  Drenaje de moco espeso por la nariz, que generalmente es de color verde y puede contener pus (purulento).  Hinchazn y calor en los senos paranasales afectados. DIAGNSTICO  El mdico le har un examen fsico. Durante el examen, el mdico:   Revisar su nariz buscando signos de crecimientos anormales en las fosas nasales (plipos  nasales).  Palpar los senos paranasales afectados para buscar signos de infeccin.  Observar el interior de los senos paranasales (endoscopa) con un dispositivo especial que emite luz (endoscopio) colocndolo dentro de los senos paranasales. Si el mdico sospecha que usted sufre sinusitis crnica, podr indicar una o ms de las siguientes pruebas:   Pruebas de Buyer, retail.  Cultivo de secreciones nasales: tomar Truddie Coco del moco nasal y la enviar a un laboratorio para detectar bacterias.  Citologa nasal: el mdico tomar [place] de moco de la nariz para determinar si la sinusitis que usted sufre est relacionada con Obie Dredge. TRATAMIENTO  La mayora de los casos de sinusitis aguda se deben a una infeccin viral y se resuelven espontneamente dentro de los 10 das. En algunos casos se recetan medicamentos para [employer] (analgsicos, descongestivos, aerosoles nasales con corticoides o aerosoles salinos).  Sin embargo, para la sinusitis por infeccin bacteriana, Camera operator. Los antibiticos son medicamentos que destruyen las bacterias que causan la infeccin.  Rara vez la sinusitis tiene su origen en una infeccin por hongos. En estos casos, Designer, television/film set un medicamento antifngico.  Para algunos casos de sinusitis crnica, es necesario someterse a [place]. Generalmente se trata de [employer] la sinusitis se repite ms de 3 veces al ao, a pesar de otros tratamientos.  INSTRUCCIONES PARA EL CUIDADO EN EL HOGAR   Tiene que beber gran cantidad de agua. Los lquidos ayudan a Psychologist, educational moco para que drene ms fcilmente de los senos paranasales.  Use  un humidificador.  Inhale vapor de 3 a 4 veces al da (por ejemplo, sintese en el bao con la ducha abierta).  Aplique un pao tibio y hmedo en su cara 3  4 veces al da, o segn las indicaciones de su mdico.  Use un aerosol nasal salino para ayudar a Environmental education officer y [employer]  senos nasales.  Tome medicamentos de venta libre o recetados para Acupuncturist, Environmental health practitioner o la fiebre slo segn las indicaciones de su mdico. SOLICITE ATENCIN MDICA DE INMEDIATO SI:   Siente ms dolor o sufre dolores de cabeza intensos.  Tiene nuseas, vmitos o somnolencia.  Observa hinchazn alrededor del rostro.  Tiene problemas de visin.  Presenta rigidez en el cuello.  Tiene dificultad para respirar. ASEGRESE DE QUE:   Comprende estas instrucciones.  Controlar su enfermedad.  Recibir ayuda de inmediato si no mejora o si empeora. Document Released: 04/11/2005 Document Revised: 03/04/2013 [facility] Patient Information 2014 [place], [place].  Vrtigo (Vertigo)  Vrtigo es la sensacin de que se est moviendo estando quieto. Puede ser peligroso si ocurre cuando est trabajado, conduciendo vehculos o realizando actividades difciles.  CAUSAS  El vrtigo se produce cuando hay un conflicto en las seales que se envan al cerebro desde los sistemas visual y sensorial del cuerpo. Hay numerosas causas que [employer], entre las que se incluyen:   Infecciones, especialmente en el odo interno.  Nelia Shi reaccin a un medicamento o mal uso de alcohol y frmacos.  Abstinencia de drogas o alcohol.  Cambios rpidos de posicin, como al [employer] o darse vuelta en la cama.  Dolor de Surveyor, minerals.  Disminucin del flujo sanguneo hacia el cerebro.  Aumento de la presin en el cerebro por un traumatismo, infeccin, tumor o sangrado en la cabeza. SNTOMAS  Puede sentir como si el mundo da vueltas o va a caer al piso. Como hay problemas en el equilibrio, el vrtigo puede causar nuseas y vmitos. Tiene movimientos oculares involuntarios (nistagmus).  DIAGNSTICO  El vrtigo normalmente se diagnostica con un examen fsico. Si la causa no se conoce, el mdico puede indicar diagnstico por imgenes, como una resonancia magntica (imgenes por Radiographer, therapeutic).  TRATAMIENTO  La mayor parte de los casos de vrtigo se resuelve sin TEFL teacher. Segn la causa, el mdico podr recetar ciertos medicamentos. Si se relaciona con la posicin del cuerpo, podr recomendarle movimientos o procedimientos para corregir el problema. En algunos casos raros, si la causa del vrtigo es un problema en el odo interno, necesitar [place].  INSTRUCCIONES PARA EL CUIDADO DOMICILIARIO  Siga las indicaciones del mdico.  Evite conducir vehculos.  Evite operar maquinarias pesadas.  Evite realizar tareas que seran peligrosas para usted u otras personas durante un episodio de vrtigo.  Comunquele al mdico si nota que ciertos medicamentos parecen asociarse con las crisis. Algunos medicamentos que se usan para tratar los episodios, en [employer]. SOLICITE ATENCIN MDICA DE INMEDIATO SI:  Los medicamentos no [place] las crisis o hacen que estas empeoren.  Tiene dificultad para hablar, caminar, siente debilidad o tiene problemas para [employer], las manos o las piernas.  Comienza a sufrir un dolor de cabeza intenso.  Las nuseas y los vmitos no se [place] o se Press photographer.  Aparecen trastornos visuales.  Un miembro de su familia nota cambios en su conducta.  Hay alguna modificacin en su trastorno que parece Holiday representative de Scientist, clinical (histocompatibility and immunogenetics). ASEGRESE DE QUE:   Comprende estas instrucciones.  Controlar  su enfermedad.  Solicitar ayuda de inmediato si no mejora o si empeora. Document Released: 04/11/2005 Document Revised: 09/24/2011 [facility] Patient Information 2014 [place], [place].

## 2013-09-02 NOTE — ED Notes (Signed)
Patient waiting for MRI

## 2013-09-02 NOTE — ED Notes (Signed)
Patient reports feeling nauseous and dizzy starting on Sunday. Patient denies any vomiting or diarrhea. Patient complains of "feeling hot and sweating" at times.

## 2013-09-05 NOTE — ED Provider Notes (Signed)
CSN: [id number]     Arrival date & time 09/02/13  4098 History   First MD Initiated Contact with Patient 09/02/13 0840     Chief Complaint  Patient presents with  . Dizziness  . Nausea      HPI Patient presents emergency Department with developing "dizziness" over the past 48 hours.  Patient reports this is the second episode of dizziness that she's had in the past month.  She states it feels like the room is spinning it seems to worsen when her head movements.  She also reports some gait instability.  She's had mild headache.  She denies other symptoms.  No fevers or chills.  No upper respiratory symptoms.  She's had nausea but denies vomiting.  No diarrhea.   History reviewed. No pertinent past medical history. Past Surgical History  Procedure Laterality Date  . Footsurgery     . Cesarean section     Family History  Problem Relation Age of Onset  . Diabetes Mother   . Hyperlipidemia Mother    History  Substance Use Topics  . Smoking status: Never Smoker   . Smokeless tobacco: Not on file  . Alcohol Use: No   OB History   Grav Para Term Preterm Abortions TAB SAB Ect Mult Living                 Review of Systems  All other systems reviewed and are negative.      Allergies  Review of patient's allergies indicates no known allergies.  Home Medications   Current Outpatient Rx  Name  Route  Sig  Dispense  Refill  . Multiple Vitamin (MULTIVITAMIN WITH MINERALS) TABS tablet   Oral   Take 1 tablet by mouth daily.         Marland Kitchen amoxicillin-clavulanate (AUGMENTIN) 875-125 MG per tablet   Oral   Take 1 tablet by mouth every 12 (twelve) hours.   14 tablet   0   . meclizine (ANTIVERT) 50 MG tablet   Oral   Take 0.5 tablets (25 mg total) by mouth 3 (three) times daily as needed for dizziness.   20 tablet   0   . promethazine (PHENERGAN) 25 MG tablet   Oral   Take 1 tablet (25 mg total) by mouth every 6 (six) hours as needed for nausea or vomiting.   15 tablet    0    BP 95/56  Pulse 74  Temp(Src) 98 F (36.7 C) (Oral)  Resp 20  Ht 5\' 7"  (1.702 m)  Wt 164 lb (74.39 kg)  BMI 25.68 kg/m2  SpO2 98%  LMP 08/21/2013 Physical Exam  Nursing note and vitals reviewed. Constitutional: She is oriented to person, place, and time. She appears well-developed and well-nourished. No distress.  HENT:  Head: Normocephalic and atraumatic.  Eyes: EOM are normal. Pupils are equal, round, and reactive to light.  Neck: Normal range of motion.  Cardiovascular: Normal rate, regular rhythm and normal heart sounds.   Pulmonary/Chest: Effort normal and breath sounds normal.  Abdominal: Soft. She exhibits no distension. There is no tenderness.  Musculoskeletal: Normal range of motion.  Neurological: She is alert and oriented to person, place, and time.  5/5 strength in major muscle groups of  bilateral upper and lower extremities. Speech normal. No facial asymetry.   Skin: Skin is warm and dry.  Psychiatric: She has a normal mood and affect. Judgment normal.    ED Course  Procedures (including critical care time) Labs Review  Labs Reviewed  CBC - Abnormal; Notable for the following:    Hemoglobin 11.4 (*)    HCT 34.2 (*)    All other components within normal limits  BASIC METABOLIC PANEL - Abnormal; Notable for the following:    GFR calc non Af Amer 87 (*)    All other components within normal limits  HCG, SERUM, QUALITATIVE   Imaging Review EXAM:  MRI HEAD WITHOUT CONTRAST  TECHNIQUE:  Multiplanar, multiecho pulse sequences of the brain and surrounding  structures were obtained without intravenous contrast.  COMPARISON: [facility].  FINDINGS:  Cerebral volume is within normal limits. No restricted diffusion to  suggest acute infarction. No midline shift, mass effect, evidence of  mass lesion, ventriculomegaly, extra-axial collection or acute  intracranial hemorrhage. Cervicomedullary junction and pituitary are   within normal limits. Negative visualized cervical spine. Major  intracranial vascular flow voids are preserved.  There are a small number of scattered chronic micro hemorrhages in  the brain, including at the left caudate (series 9, image 90).  Nearby small chronic lacunar infarct of the left corona radiata  (series 8, image 16), unchanged since 2005. No cortical  encephalomalacia. Otherwise normal gray and white matter signal.  Visible internal auditory structures appear normal. Mastoids are  clear. Hyperostosis of the calvarium. Bone marrow signal at the  skullbase and in the visible cervical spine is normal. Visualized  orbit soft tissues are within normal limits. Visualized scalp soft  tissues are within normal limits.  Widespread paranasal sinus mucosal thickening and sinus fluid  levels. Inflammation is greater on the right side.  IMPRESSION:  1. No acute intracranial abnormality. 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  2. Widespread paranasal sinus inflammation including fluid levels  suggesting acute sinusitis.   EKG Interpretation    Date/Time:  Wednesday September 02 2013 08:44:49 EST Ventricular Rate:  66 PR Interval:  149 QRS Duration: 85 QT Interval:  401 QTC Calculation: 420 R Axis:   56 Text Interpretation:  Sinus rhythm No previous ECGs available Confirmed by Latroy Gaymon  MD, Omesha Bowerman ([id number]) on 09/02/2013 11:04:22 AM            MDM   Final diagnoses:  Vertigo  Sinus infection    MRI demonstrates no central process.  It could be that her sinusitis is causing her vertiginous symptoms.  Patient be treated for sinusitis.  Outpatient followup. patient feels better in the emergency department after treatment with benzodiazepines and meclizine.    Lyanne CoKevin M Zarion Oliff, MD 09/05/13 [phone]

## 2014-06-02 ENCOUNTER — Other Ambulatory Visit: Payer: Self-pay | Admitting: Obstetrics and Gynecology

## 2014-06-02 DIAGNOSIS — Z1231 Encounter for screening mammogram for malignant neoplasm of breast: Secondary | ICD-10-CM

## 2014-07-07 ENCOUNTER — Ambulatory Visit (HOSPITAL_COMMUNITY): Payer: Self-pay

## 2014-07-12 ENCOUNTER — Other Ambulatory Visit (HOSPITAL_COMMUNITY): Payer: Self-pay | Admitting: *Deleted

## 2014-07-12 DIAGNOSIS — N644 Mastodynia: Secondary | ICD-10-CM

## 2014-07-29 ENCOUNTER — Encounter (HOSPITAL_COMMUNITY): Payer: Self-pay

## 2014-07-29 ENCOUNTER — Ambulatory Visit: Payer: Self-pay

## 2014-07-29 ENCOUNTER — Other Ambulatory Visit: Payer: Self-pay | Admitting: Obstetrics and Gynecology

## 2014-07-29 ENCOUNTER — Ambulatory Visit (HOSPITAL_COMMUNITY)
Admission: RE | Admit: 2014-07-29 | Discharge: 2014-07-29 | Disposition: A | Discharge status: Home / Self Care | Payer: Self-pay | Source: Ambulatory Visit | Attending: Obstetrics and Gynecology | Admitting: Obstetrics and Gynecology

## 2014-07-29 ENCOUNTER — Ambulatory Visit (HOSPITAL_COMMUNITY): Payer: Self-pay

## 2014-07-29 ENCOUNTER — Ambulatory Visit: Admission: RE | Admit: 2014-07-29 | Payer: Self-pay | Source: Ambulatory Visit

## 2014-07-29 VITALS — BP 108/64 | Temp 98.0°F | Ht 67.0 in | Wt 165.4 lb

## 2014-07-29 DIAGNOSIS — Z01419 Encounter for gynecological examination (general) (routine) without abnormal findings: Secondary | ICD-10-CM

## 2014-07-29 DIAGNOSIS — Z1231 Encounter for screening mammogram for malignant neoplasm of breast: Secondary | ICD-10-CM

## 2014-07-29 NOTE — Progress Notes (Signed)
Complaints of AUB.  Pap Smear: Completed Pap smear today. [facility] Pap smear was in 2012 at [facility] and normal per patient. Per patient has a history of an abnormal Pap smear 16 years ago that required a LEEP for follow-up. Patient stated all of her Pap smears have been normal since and she has had at least three Pap smears. No Pap smear results in EPIC.  Physical exam: Breasts Breasts symmetrical. No skin abnormalities bilateral breasts. No nipple retraction bilateral breasts. No nipple discharge bilateral breasts. No lymphadenopathy. No lumps palpated bilateral breasts. No complaints of pain or tenderness on exam. Patient escorted to mammography for a screening mammogram.          Pelvic/Bimanual   Ext Genitalia No lesions, no swelling and no discharge observed on external genitalia.         Vagina Vagina pink and normal texture. No lesions or discharge observed in vagina.          Cervix Cervix is present. Cervix pink and of normal texture. Cervical Polyp observed at os. No discharge observed on cervix. Will refer [facility] to [facility] to have polyp removed.    Uterus Uterus is present and palpable. Uterus in normal position and normal size.       Adnexae Bilateral ovaries present and palpable. No tenderness on palpation.        Rectovaginal No rectal exam completed today since patient had no rectal complaints. No skin abnormalities observed on exam.

## 2014-07-29 NOTE — Patient Instructions (Addendum)
Explained to Cathy [name] that [facility] will cover Pap smears and co-testing every 5 years unless has a history of abnormal Pap smears. Let patient know will follow up with her within the next couple weeks with results of Pap smear by phone. Let [facility] know [facility] will [facility]-up with her within the next couple weeks with results of mammogram by letter or phone.  Explained to patient the cervical polyp observed on exam and that I will refer her to the [facility] to have removed. Let her know [facility] will call her with the appointment. Let her know that [facility] doesn't cover the follow-up and that she will need to complete financial assistance Pap smear. Cathy [name] verbalized understanding. Patient escorted to mammography for a screening mammogram.

## 2014-08-02 LAB — CYTOLOGY - PAP

## 2014-08-10 ENCOUNTER — Telehealth (HOSPITAL_COMMUNITY): Payer: Self-pay | Admitting: *Deleted

## 2014-08-10 NOTE — Telephone Encounter (Signed)
Telephoned patient at home # and discussed negative pap smear results. HPV was also negative. Next pap smear due in 5 years. Patient voiced understanding. Used interpreter Delorise RoyalsJulie Sowell.

## 2014-08-23 ENCOUNTER — Ambulatory Visit (INDEPENDENT_AMBULATORY_CARE_PROVIDER_SITE_OTHER): Payer: Self-pay | Admitting: Obstetrics & Gynecology

## 2014-08-23 ENCOUNTER — Encounter: Payer: Self-pay | Admitting: Obstetrics & Gynecology

## 2014-08-23 ENCOUNTER — Other Ambulatory Visit (HOSPITAL_COMMUNITY)
Admission: RE | Admit: 2014-08-23 | Discharge: 2014-08-23 | Disposition: A | Discharge status: Home / Self Care | Payer: Self-pay | Source: Ambulatory Visit | Attending: Obstetrics & Gynecology | Admitting: Obstetrics & Gynecology

## 2014-08-23 VITALS — BP 105/61 | HR 72 | Wt 164.3 lb

## 2014-08-23 DIAGNOSIS — N841 Polyp of cervix uteri: Secondary | ICD-10-CM | POA: Insufficient documentation

## 2014-08-23 NOTE — Patient Instructions (Signed)
Regrese a la clinica cuando tenga su cita. Si tiene problemas o preguntas, llama a la clinica o vaya a la sala de emergencia al [facility].    

## 2014-08-23 NOTE — Progress Notes (Signed)
   CLINIC ENCOUNTER NOTE  History:  44 y.o. [id number] here today for removal of cervical polyp noted during [facility] smear [facility] two weeks ago. Also reports irregular bleeding. Patient is Spanish-speaking only, Spanish interpreter present for this encounter.  The following portions of the patient's history were reviewed and updated as appropriate: allergies, current medications, past family history, past medical history, past social history, past surgical history and problem list. Normal pap and negative HRHPV in 07/2014. Normal mammogram on 07/29/14.   Review of Systems:  Pertinent items are noted in HPI.  Objective:  Physical Exam BP 105/61 mmHg  Pulse 72  Wt 164 lb 4.8 oz (74.526 kg)  LMP 08/07/2014 Gen: NAD Abd: Soft, nontender and nondistended Pelvic: Normal appearing external genitalia; normal appearing vaginal mucosa and cervix.  Small polypoid lesion noted in cervical canal, flush with ectocervix.  Normal discharge.    Polypoid lesion noted in the ectocervix with narrow stalk.  CERVICAL POLYPECTOMY NOTE Risks of the biopsy including pain, bleeding, infection, inadequate specimen, and need for additional procedures were discussed. The patient stated understanding and agreed to undergo procedure today. Consent was signed,  time out performed.  The patient's cervix was prepped with Betadine.  Kelly forceps were used to grasp the polypoid lesion and the polypoid lesion was removed by twisting it off its base.  Tissue sent to pathology for analysis.  Small bleeding was noted and hemostasis was achieved using silver nitrate sticks.  The patient tolerated the procedure well. Post-procedure instructions were given to the patient. The patient is to call with heavy bleeding or other concerns.   Assessment & Plan:  Will follow up pathology and contact patient with results Routine preventative health maintenance measures emphasized.   Jaynie CollinsUGONNA  Loney Domingo, MD, FACOG Attending  Obstetrician & Gynecologist [facility], [facility]

## 2014-08-26 ENCOUNTER — Telehealth: Payer: Self-pay | Admitting: *Deleted

## 2014-08-26 NOTE — Telephone Encounter (Signed)
-----   Message from Tereso NewcomerUgonna A Anyanwu, MD sent at 08/24/2014 10:56 AM EST ----- Benign cervical polyp. Please call to inform patient of results.  Spanish speaking only.

## 2014-08-26 NOTE — Telephone Encounter (Signed)
Contacted patient with Spanish Interpreter [name], results given, pt has no further questions.

## 2014-09-20 ENCOUNTER — Encounter: Payer: Self-pay | Admitting: *Deleted

## 2015-08-10 ENCOUNTER — Ambulatory Visit (INDEPENDENT_AMBULATORY_CARE_PROVIDER_SITE_OTHER): Payer: Self-pay | Admitting: Physician Assistant

## 2015-08-10 VITALS — BP 108/68 | HR 60 | Temp 98.3°F | Resp 16 | Ht 67.0 in | Wt 161.0 lb

## 2015-08-10 DIAGNOSIS — R110 Nausea: Secondary | ICD-10-CM

## 2015-08-10 DIAGNOSIS — R42 Dizziness and giddiness: Secondary | ICD-10-CM

## 2015-08-10 MED ORDER — ONDANSETRON HCL 4 MG PO TABS: 4.0000 mg | ORAL_TABLET | Freq: Three times a day (TID) | ORAL | Status: DC | PRN

## 2015-08-10 MED ORDER — MECLIZINE HCL 25 MG PO TABS: 25.0000 mg | ORAL_TABLET | Freq: Three times a day (TID) | ORAL | Status: DC | PRN

## 2015-08-10 NOTE — Patient Instructions (Signed)
Benign Positional Vertigo Vertigo is the feeling that you or your surroundings are moving when they are not. Benign positional vertigo is the most common form of vertigo. The cause of this condition is not serious (is benign). This condition is triggered by certain movements and positions (is positional). This condition can be dangerous if it occurs while you are doing something that could endanger you or others, such as driving.  CAUSES In many cases, the cause of this condition is not known. It may be caused by a disturbance in an area of the inner ear that helps your brain to sense movement and balance. This disturbance can be caused by a viral infection (labyrinthitis), head injury, or repetitive motion. RISK FACTORS This condition is more likely to develop in:  Women.  People who are 50 years of age or older. SYMPTOMS Symptoms of this condition usually happen when you move your head or your eyes in different directions. Symptoms may start suddenly, and they usually last for less than a minute. Symptoms may include:  Loss of balance and falling.  Feeling like you are spinning or moving.  Feeling like your surroundings are spinning or moving.  Nausea and vomiting.  Blurred vision.  Dizziness.  Involuntary eye movement (nystagmus). Symptoms can be mild and cause only slight annoyance, or they can be severe and interfere with daily life. Episodes of benign positional vertigo may return (recur) over time, and they may be triggered by certain movements. Symptoms may improve over time. DIAGNOSIS This condition is usually diagnosed by medical history and a physical exam of the head, neck, and ears. You may be referred to a health care provider who specializes in ear, nose, and throat (ENT) problems (otolaryngologist) or a provider who specializes in disorders of the nervous system (neurologist). You may have additional testing, including:  MRI.  A CT scan.  Eye movement tests. Your  health care provider may ask you to change positions quickly while he or she watches you for symptoms of benign positional vertigo, such as nystagmus. Eye movement may be tested with an electronystagmogram (ENG), caloric stimulation, the Dix-Hallpike test, or the roll test.  An electroencephalogram (EEG). This records electrical activity in your brain.  Hearing tests. TREATMENT Usually, your health care provider will treat this by moving your head in specific positions to adjust your inner ear back to normal. Surgery may be needed in severe cases, but this is rare. In some cases, benign positional vertigo may resolve on its own in 2-4 weeks. HOME CARE INSTRUCTIONS Safety  Move slowly.Avoid sudden body or head movements.  Avoid driving.  Avoid operating heavy machinery.  Avoid doing any tasks that would be dangerous to you or others if a vertigo episode would occur.  If you have trouble walking or keeping your balance, try using a cane for stability. If you feel dizzy or unstable, sit down right away.  Return to your normal activities as told by your health care provider. Ask your health care provider what activities are safe for you. General Instructions  Take over-the-counter and prescription medicines only as told by your health care provider.  Avoid certain positions or movements as told by your health care provider.  Drink enough fluid to keep your urine clear or pale yellow.  Keep all follow-up visits as told by your health care provider. This is important. SEEK MEDICAL CARE IF:  You have a fever.  Your condition gets worse or you develop new symptoms.  Your family or friends   notice any behavioral changes.  Your nausea or vomiting gets worse.  You have numbness or a "pins and needles" sensation. SEEK IMMEDIATE MEDICAL CARE IF:  You have difficulty speaking or moving.  You are always dizzy.  You faint.  You develop severe headaches.  You have weakness in your  legs or arms.  You have changes in your hearing or vision.  You develop a stiff neck.  You develop sensitivity to light.   This information is not intended to replace advice given to you by your health care provider. Make sure you discuss any questions you have with your health care provider.   Document Released: 04/09/2006 Document Revised: 03/23/2015 Document Reviewed: 10/25/2014 Elsevier Interactive Patient Education 2016 Elsevier Inc.  

## 2015-08-10 NOTE — Progress Notes (Signed)
   Cathy [name]  MRN: [id number] DOB: 12-29-1970  Subjective:  Pt presents to clinic with vertigo again.  She had it about 2 years ago and it resolved with medications and she has been fine since then.  She woke up early yesterday am with the symptoms - room is spinning - more to the left when she moves her head - she is having nausea when she gets the vertigo.  She has had a headache since yesterday after the symptoms started and she had this also 2 years ago.  She has done nothing since the symptoms started other than keeping her head still.  Patient Active Problem List   Diagnosis Date Noted  . Vertigo 08/10/2015    Current Outpatient Prescriptions on File Prior to Visit  Medication Sig Dispense Refill  . Multiple Vitamin (MULTIVITAMIN WITH MINERALS) TABS tablet Take 1 tablet by mouth daily. Reported on 08/10/2015     No current facility-administered medications on file prior to visit.    No Known Allergies  Review of Systems  Constitutional: Negative for fever and chills.  HENT: Negative for congestion, rhinorrhea, sinus pressure and sore throat.   Gastrointestinal: Positive for nausea. Negative for vomiting and diarrhea.  Neurological: Positive for headaches. Negative for weakness.   Objective:  BP 108/68 mmHg  Pulse 60  Temp(Src) 98.3 F (36.8 C) (Oral)  Resp 16  Ht  (1.702 m)  Wt 161 lb (73.029 kg)  BMI 25.21 kg/m2  SpO2 99%  LMP 08/09/2015  Physical Exam  Constitutional: She is oriented to person, place, and time and well-developed, well-nourished, and in no distress.  HENT:  Head: Normocephalic and atraumatic.  Right Ear: Hearing and external ear normal.  Left Ear: Hearing and external ear normal.  Eyes: Conjunctivae, EOM and lids are normal. Pupils are equal, round, and reactive to light. Right eye exhibits normal extraocular motion and no nystagmus. Left eye exhibits normal extraocular motion and no nystagmus.  Neck: Normal range of motion. Neck supple.    Cardiovascular: Normal rate, regular rhythm and normal heart sounds.   No murmur heard. Pulmonary/Chest: Effort normal and breath sounds normal.  Neurological: She is alert and oriented to person, place, and time. She has normal strength, normal reflexes and intact cranial nerves. No cranial nerve deficit. Gait normal. Gait normal.  dix hallpike + to the left with head movement -- Eply maneuvars performed on the patient and afterwards she felt better  Skin: Skin is warm and dry.  Psychiatric: Mood, memory, affect and judgment normal.  Vitals reviewed.   Assessment and Plan :  Vertigo - Plan: meclizine (ANTIVERT) 25 MG tablet, Care order/instruction  Nausea without vomiting - Plan: ondansetron (ZOFRAN) 4 MG tablet   D/w pt doing modified Eply at home.  She will f/u if she is still having problems in 5-7 days or if she has any additional symptoms.  She is currently not having sinus problems but 2 years ago she did have a sinus infection so if her symptoms do not improve it might be worth treating her for sinus infection.  Her questions were answered.  Benny Lennert PA-C  [facility] 08/10/2015 9:38 AM

## 2015-09-29 ENCOUNTER — Ambulatory Visit (INDEPENDENT_AMBULATORY_CARE_PROVIDER_SITE_OTHER): Payer: Self-pay | Admitting: Family Medicine

## 2015-09-29 VITALS — BP 118/70 | HR 67 | Temp 97.7°F | Resp 18 | Wt 167.2 lb

## 2015-09-29 DIAGNOSIS — D509 Iron deficiency anemia, unspecified: Secondary | ICD-10-CM

## 2015-09-29 DIAGNOSIS — R059 Cough, unspecified: Secondary | ICD-10-CM

## 2015-09-29 DIAGNOSIS — R42 Dizziness and giddiness: Secondary | ICD-10-CM

## 2015-09-29 DIAGNOSIS — B349 Viral infection, unspecified: Secondary | ICD-10-CM

## 2015-09-29 DIAGNOSIS — R05 Cough: Secondary | ICD-10-CM

## 2015-09-29 LAB — POCT CBC
GRANULOCYTE PERCENT: 55.9 % (ref 37–80)
HEMATOCRIT: 31.9 % — AB (ref 37.7–47.9)
Hemoglobin: 11.1 g/dL — AB (ref 12.2–16.2)
LYMPH, POC: 1.8 (ref 0.6–3.4)
MCH: 26.9 pg — AB (ref 27–31.2)
MCHC: 34.7 g/dL (ref 31.8–35.4)
MCV: 77.6 fL — AB (ref 80–97)
MID (cbc): 0.3 (ref 0–0.9)
MPV: 9.4 fL (ref 0–99.8)
POC GRANULOCYTE: 2.6 (ref 2–6.9)
POC LYMPH PERCENT: 37.4 %L (ref 10–50)
POC MID %: 6.7 %M (ref 0–12)
Platelet Count, POC: 218 10*3/uL (ref 142–424)
RBC: 4.11 M/uL (ref 4.04–5.48)
RDW, POC: 16.3 %
WBC: 4.7 10*3/uL (ref 4.6–10.2)

## 2015-09-29 LAB — BASIC METABOLIC PANEL
BUN: 8 mg/dL (ref 7–25)
CO2: 27 mmol/L (ref 20–31)
CREATININE: 0.76 mg/dL (ref 0.50–1.10)
Calcium: 9.7 mg/dL (ref 8.6–10.2)
Chloride: 103 mmol/L (ref 98–110)
Glucose, Bld: 107 mg/dL — ABNORMAL HIGH (ref 65–99)
Potassium: 4.7 mmol/L (ref 3.5–5.3)
Sodium: 140 mmol/L (ref 135–146)

## 2015-09-29 LAB — GLUCOSE, POCT (MANUAL RESULT ENTRY): POC GLUCOSE: 118 mg/dL — AB (ref 70–99)

## 2015-09-29 LAB — FERRITIN: FERRITIN: 4 ng/mL — AB (ref 10–232)

## 2015-09-29 LAB — POCT INFLUENZA A/B
INFLUENZA A, POC: NEGATIVE
Influenza B, POC: NEGATIVE

## 2015-09-29 MED ORDER — MECLIZINE HCL 25 MG PO TABS: 25.0000 mg | ORAL_TABLET | Freq: Three times a day (TID) | ORAL | Status: DC | PRN

## 2015-09-29 MED ORDER — BENZONATATE 100 MG PO CAPS: ORAL_CAPSULE | ORAL | Status: DC

## 2015-09-29 NOTE — Patient Instructions (Addendum)
Your labs all look good except for the red blood is a little low, which is call anemia.  Take iron over the counter one daily for the low bloos  Use the meclizine 25 mg 1 pill 3 times daily again for the dizziness  Take benzonatate cough pills one pill or 2 pills every 8 hours as needed for cough  Take tylenol 500 2 pills 3 pills three times daily if needed for neck pain or headache  If the meclizine is not making the dizziness go away please return  Plan to stay off of work through the weekend.  IF you received an x-ray today, you will receive an invoice from [facility]. Please contact [facility] at [phone] with questions or concerns regarding your invoice.   IF you received labwork today, you will receive an invoice from [facility]. Please contact [facility] at [phone] with questions or concerns regarding your invoice.   Our billing staff will not be able to assist you with questions regarding bills from these companies.  You will be contacted with the lab results as soon as they are available. The fastest way to get your results is to activate your My Chart account. Instructions are located on the last page of this paperwork. If you have not heard from us regarding the results in 2 weeks, please contact this office.

## 2015-09-29 NOTE — Addendum Note (Signed)
Addended by: Tama HeadingsBATES, Helen Winterhalter A on: 09/29/2015 03:08 PM   Modules accepted: Orders

## 2015-09-29 NOTE — Progress Notes (Signed)
Patient ID: Cathy [name], female    DOB: 24-Feb-1971  Age: 45 y.o. MRN: [id number]  Chief Complaint  Patient presents with  . Dizziness    Started last night. Attempted to go to work this morning, but dizziness became worse     Subjective:   45 year old lady who has a history of dizziness episodes in the past. Please refer back to the note in January. She also had some 2 years ago. She did better for a while. She has had a respiratory tract infection last week some coughing and then. Today she comes in with history of having been okay yesterday and then last night developed dizziness. She tried to go to work today but was unable to work so came in here to the office. Her husband brought her in. She is feeling ill. The dizziness has abated a little bit right now and she is able to sit up. Initially when she came in she had to lie down. She has not had any major headache. Minimal congestion. She does have a little bit of a hacking cough. She just doesn't feel well.  Current allergies, medications, problem list, past/family and social histories reviewed.  Objective:  BP 118/70 mmHg  Pulse 67  Temp(Src) 97.7 F (36.5 C) (Oral)  Resp 18  Wt 167 lb 3.2 oz (75.841 kg)  SpO2 98%  LMP 09/21/2015  She is ill appearing. Mild hacking dry cough. Her TMs are normal. Eyes PERRLA. EOMs intact. Fundi benign with discs clear. Her throat is clear. Neck supple without significant nodes. Chest is clear to auscultation. Heart regular without murmurs. Finger to nose normal. Romberg negative. Coordination appears normal.  Assessment & Plan:   Assessment: 1. Dizziness   2. Vertigo   3. Viral illness   4. Cough   5. Microcytic anemia       Plan: I suspect this is a viral-induced dizziness episode this time, will check a couple of basic labs.  Orders Placed This Encounter  Procedures  . Basic metabolic panel  . Ferritin  . POCT CBC  . POCT Influenza A/B  . POCT glucose (manual entry)    Meds  ordered this encounter  Medications  . meclizine (ANTIVERT) 25 MG tablet    Sig: Take 1 tablet (25 mg total) by mouth 3 (three) times daily as needed for dizziness.    Dispense:  30 tablet    Refill:  2   Results for orders placed or performed in visit on 09/29/15  POCT CBC  Result Value Ref Range   WBC 4.7 4.6 - 10.2 K/uL   Lymph, poc 1.8 0.6 - 3.4   POC LYMPH PERCENT 37.4 10 - 50 %L   MID (cbc) 0.3 0 - 0.9   POC MID % 6.7 0 - 12 %M   POC Granulocyte 2.6 2 - 6.9   Granulocyte percent 55.9 37 - 80 %G   RBC 4.11 4.04 - 5.48 M/uL   Hemoglobin 11.1 (A) 12.2 - 16.2 g/dL   HCT, POC 40.9 (A) 81.1 - 47.9 %   MCV 77.6 (A) 80 - 97 fL   MCH, POC 26.9 (A) 27 - 31.2 pg   MCHC 34.7 31.8 - 35.4 g/dL   RDW, POC 91.4 %   Platelet Count, POC 218 142 - 424 K/uL   MPV 9.4 0 - 99.8 fL  POCT Influenza A/B  Result Value Ref Range   Influenza A, POC Negative Negative   Influenza B, POC Negative Negative  POCT  glucose (manual entry)  Result Value Ref Range   POC Glucose 118 (A) 70 - 99 mg/dl    We will check a ferritin level on her. Explained to the anemia. She needs to follow-up on this. Apparently she has been having a couple of menstrual cycles a month, and may need to be checked out further after she returns in one month.    Patient Instructions  Your labs all look good except for the red blood is a little low, which is call anemia.  Take iron over the counter one daily for the low bloos  Use the meclizine 25 mg 1 pill 3 times daily again for the dizziness  Take benzonatate cough pills one pill or 2 pills every 8 hours as needed for cough  Take tylenol 500 2 pills 3 pills three times daily if needed for neck pain or headache  If the meclizine is not making the dizziness go away please return  Plan to stay off of work through the weekend.  IF you received an x-ray today, you will receive an invoice from [facility]. Please contact [facility] at [phone] with  questions or concerns regarding your invoice.   IF you received labwork today, you will receive an invoice from [facility]. Please contact [facility] at [phone] with questions or concerns regarding your invoice.   Our billing staff will not be able to assist you with questions regarding bills from these companies.  You will be contacted with the lab results as soon as they are available. The fastest way to get your results is to activate your My Chart account. Instructions are located on the last page of this paperwork. If you have not heard from us regarding the results in 2 weeks, please contact this office.       Return in about 4 weeks (around 10/27/2015).   Sharel Behne, MD 09/29/2015

## 2015-11-07 ENCOUNTER — Ambulatory Visit (INDEPENDENT_AMBULATORY_CARE_PROVIDER_SITE_OTHER): Payer: Self-pay | Admitting: Family Medicine

## 2015-11-07 VITALS — BP 114/72 | HR 65 | Temp 97.9°F | Resp 15 | Ht 67.0 in | Wt 168.6 lb

## 2015-11-07 DIAGNOSIS — D509 Iron deficiency anemia, unspecified: Secondary | ICD-10-CM

## 2015-11-07 LAB — POCT CBC
GRANULOCYTE PERCENT: 53.4 % (ref 37–80)
HEMATOCRIT: 35.2 % — AB (ref 37.7–47.9)
HEMOGLOBIN: 12.2 g/dL (ref 12.2–16.2)
LYMPH, POC: 1.9 (ref 0.6–3.4)
MCH, POC: 28.2 pg (ref 27–31.2)
MCHC: 34.7 g/dL (ref 31.8–35.4)
MCV: 81.1 fL (ref 80–97)
MID (cbc): 0.3 (ref 0–0.9)
MPV: 9.1 fL (ref 0–99.8)
POC GRANULOCYTE: 2.6 (ref 2–6.9)
POC LYMPH PERCENT: 40.6 %L (ref 10–50)
POC MID %: 6 %M (ref 0–12)
Platelet Count, POC: 186 10*3/uL (ref 142–424)
RBC: 4.33 M/uL (ref 4.04–5.48)
RDW, POC: 18.3 %
WBC: 4.8 10*3/uL (ref 4.6–10.2)

## 2015-11-07 NOTE — Patient Instructions (Addendum)
It is important to continue to rebuild the iron stores in your bone marrow. Continue taking the iron for the next 2 months, until the end of June. After that I would recommend that you take iron for 1 week every month when you have your menstrual periods.  Recommend returning in about 6 months for one more recheck. If you develop excessive weakness or tiredness come in sooner.  Anemia por deficiencia de hierro - Adultos (Iron Deficiency Anemia, Adult) La anemia es una afeccin en la que el nivel de glbulos rojos o hemoglobina en la sangre est por debajo del normal. La hemoglobina es la sustancia de los glbulos rojos que transporta el oxgeno. La anemia por deficiencia de hierro es la anemia provocada por un nivel bajo de hierro. Este es el tipo ms comn de anemia y puede provocarle cansancio y dificultad para Industrial/product designerrespirar. CAUSAS   Falta de hierro en la dieta.  Absorcin deficiente de hierro, como sucede con los trastornos intestinales.  Hemorragia intestinal.  Perodos abundantes. SIGNOS Y SNTOMAS  Cuando la anemia es leve puede pasar inadvertida. Los sntomas pueden ser:  Alma FriendlyFatiga.  Dolor de Turkmenistancabeza.  Piel plida.  Debilidad.  Cansancio.  Falta de aire.  Mareos.  Manos y pies fros.  Frecuencia cardaca acelerada o irregular. DIAGNSTICO  El diagnstico requiere una evaluacin y un examen fsico minuciosos por parte del mdico. [place] CenterGeneralmente, se realizan anlisis de sangre para confirmar la anemia por deficiencia de hierro. Es posible que se realicen anlisis adicionales para encontrar la causa subyacente de la anemia. Estos pueden ser:  Anlisis para Engineer, manufacturingdetectar la presencia de VF Corporationsangre en las heces (anlisis de sangre oculta en heces).  Un procedimiento para examinar el interior del colon y el recto (colonoscopia).  Un procedimiento para examinar el interior del esfago y Investment banker, corporateel estmago (endoscopia). TRATAMIENTO  La anemia por deficiencia de hierro se trata al remediar la causa  de la deficiencia. El tratamiento incluye:  Agregarle a la dieta alimentos ricos en hierro.  Tomar suplementos de hierro. Las mujeres embarazadas o que estn amamantando necesitan una dosis adicional de hierro, ya que su dieta normal generalmente no proporciona la cantidad necesaria.  Tomar vitaminas. La vitamina C mejora la absorcin del hierro. Es posible que el mdico le recomiende tomar los comprimidos de hierro con un vaso de jugo de naranja o con un suplemento de vitamina C.  Medicamentos para disminuir el flujo menstrual abundante.  Ciruga. INSTRUCCIONES PARA EL CUIDADO EN EL HOGAR   Tome el hierro segn las indicaciones del mdico.  Si no puede tragar los suplementos de hierro, hable con su mdico sobre la posibilidad de que se lo coloquen en la vena (va intravenosa) o mediante una inyeccin en el msculo.  Para lograr una mejor absorcin del hierro, los suplementos se deben tomar con el estmago vaco. Si no los tolera con el estmago vaco, posiblemente deba tomarlos con la comida.  No beba leche ni tome anticidos al American Standard Companiesmismo tiempo que los suplementos de hierro, ya que estos pueden interferir en la absorcin del hierro.  Los suplementos de hierro pueden Investment banker, corporatecausar estreimiento. Para prevenirlo, asegrese de incluir fibras en su dieta. Posiblemente tambin se recomiende el uso de un ablandador de heces.  Tome las vitaminas segn las indicaciones del mdico.  Siga una dieta rica en hierro. Los alimentos con alto contenido de hierro incluyen hgado, carne magra, pan de grano integral, Covingtonhuevos, frutas deshidratadas y vegetales de hojas color verde oscuro. SOLICITE ATENCIN MDICA DE INMEDIATO SI:  Se desmaya. Si esto sucede, no conduzca. Comunquese con el servicio de emergencias de su localidad (911 en los [place]) si no dispone de [place].  Siente dolor en el pecho.  Se siente nauseoso o vomita.  Presenta una dificultad respiratoria grave o en aumento al [employer].  Se siente dbil.  Tiene una frecuencia cardaca acelerada.  Comienza a sudar sin motivo.  Se siente mareado cuando se levanta de una silla o de la cama. ASEGRESE DE QUE:   Comprende estas instrucciones.  Controlar su afeccin.  Recibir ayuda de inmediato si no mejora o si empeora.   Esta informacin no tiene Theme park manager el consejo del mdico. Asegrese de hacerle al mdico cualquier pregunta que tenga.   Document Released: 07/02/2005 Document Revised: 07/23/2014 Elsevier Interactive Patient Education [employer]     IF you received an x-ray today, you will receive an invoice from [facility]. Please contact [facility] at [phone] with questions or concerns regarding your invoice.   IF you received labwork today, you will receive an invoice from [facility]. Please contact [facility] at [phone] with questions or concerns regarding your invoice.   Our billing staff will not be able to assist you with questions regarding bills from these companies.  You will be contacted with the lab results as soon as they are available. The fastest way to get your results is to activate your My Chart account. Instructions are located on the last page of this paperwork. If you have not heard from [place] regarding the results in 2 weeks, please contact this office.

## 2015-11-07 NOTE — Progress Notes (Signed)
Patient ID: Cathy [name], female    DOB: May 13, 1971  Age: 45 y.o. MRN: [id number]  Chief Complaint  Patient presents with  . Follow-up    check blood work, low ferritin level at last OV     Subjective:   45 year old lady who is here for a follow-up with regard to her iron deficiency anemia from last visit. She is feeling okay. No major acute complaints today. No dizziness. She did not know of any major anemia problems in the past. The patient is Spanish-speaking, but understands some [place]. Her son with her also help translate today.  Current allergies, medications, problem list, past/family and social histories reviewed.  Objective:  BP 114/72 mmHg  Pulse 65  Temp(Src) 97.9 F (36.6 C) (Oral)  Resp 15  Ht  (1.702 m)  Wt 168 lb 9.6 oz (76.476 kg)  BMI 26.40 kg/m2  SpO2 98%  LMP 11/06/2015  No acute distress. Conjunctiva still a little pale. Neck supple. Chest clear. Heart regular without murmur.  Assessment & Plan:   Assessment: 1. Anemia, iron deficiency       Plan: Check a CBC and decide what recommendations to make.  Orders Placed This Encounter  Procedures  . POCT CBC   Results for orders placed or performed in visit on 11/07/15  POCT CBC  Result Value Ref Range   WBC 4.8 4.6 - 10.2 K/uL   Lymph, poc 1.9 0.6 - 3.4   POC LYMPH PERCENT 40.6 10 - 50 %L   MID (cbc) 0.3 0 - 0.9   POC MID % 6.0 0 - 12 %M   POC Granulocyte 2.6 2 - 6.9   Granulocyte percent 53.4 37 - 80 %G   RBC 4.33 4.04 - 5.48 M/uL   Hemoglobin 12.2 12.2 - 16.2 g/dL   HCT, POC 40.9 (A) 81.1 - 47.9 %   MCV 81.1 80 - 97 fL   MCH, POC 28.2 27 - 31.2 pg   MCHC 34.7 31.8 - 35.4 g/dL   RDW, POC 91.4 %   Platelet Count, POC 186 142 - 424 K/uL   MPV 9.1 0 - 99.8 fL     No orders of the defined types were placed in this encounter.         Patient Instructions   It is important to continue to rebuild the iron stores in your bone marrow. Continue taking the iron for the next 2  months, until the end of June. After that I would recommend that you take iron for 1 week every month when you have your menstrual periods.  Recommend returning in about 6 months for one more recheck. If you develop excessive weakness or tiredness come in sooner.  Anemia por deficiencia de hierro - Adultos (Iron Deficiency Anemia, Adult) La anemia es una afeccin en la que el nivel de glbulos rojos o hemoglobina en la sangre est por debajo del normal. La hemoglobina es la sustancia de los glbulos rojos que transporta el oxgeno. La anemia por deficiencia de hierro es la anemia provocada por un nivel bajo de hierro. Este es el tipo ms comn de anemia y puede provocarle cansancio y dificultad para Industrial/product designer. CAUSAS   Falta de hierro en la dieta.  Absorcin deficiente de hierro, como sucede con los trastornos intestinales.  Hemorragia intestinal.  Perodos abundantes. SIGNOS Y SNTOMAS  Cuando la anemia es leve puede pasar inadvertida. Los sntomas pueden ser:  [place] Friend[place].  Dolor de [place].  Piel plida.  Debilidad.  Cansancio.  Falta de aire.  Mareos.  Manos y pies fros.  Frecuencia cardaca acelerada o irregular. DIAGNSTICO  El diagnstico requiere una evaluacin y un examen fsico minuciosos por parte del mdico. [place]mente, se realizan anlisis de sangre para confirmar la anemia por deficiencia de hierro. Es posible que se realicen anlisis adicionales para encontrar la causa subyacente de la anemia. Estos pueden ser:  Anlisis para Engineer, manufacturingdetectar la presencia de VF Corporationsangre en las heces (anlisis de sangre oculta en heces).  Un procedimiento para examinar el interior del colon y el recto (colonoscopia).  Un procedimiento para examinar el interior del esfago y Investment banker, corporateel estmago (endoscopia). TRATAMIENTO  La anemia por deficiencia de hierro se trata al remediar la causa de la deficiencia. El tratamiento incluye:  Agregarle a la dieta alimentos ricos en hierro.  Tomar suplementos  de hierro. Las mujeres embarazadas o que estn amamantando necesitan una dosis adicional de hierro, ya que su dieta normal generalmente no proporciona la cantidad necesaria.  Tomar vitaminas. La vitamina C mejora la absorcin del hierro. Es posible que el mdico le recomiende tomar los comprimidos de hierro con un vaso de jugo de naranja o con un suplemento de vitamina C.  Medicamentos para disminuir el flujo menstrual abundante.  [place]. INSTRUCCIONES PARA EL CUIDADO EN EL HOGAR   Tome el hierro segn las indicaciones del mdico.  Si no puede tragar los suplementos de hierro, hable con su mdico sobre la posibilidad de que se lo coloquen en la vena (va intravenosa) o mediante una inyeccin en el msculo.  Para lograr una mejor absorcin del hierro, los suplementos se deben tomar con el estmago vaco. Si no los tolera con el estmago vaco, posiblemente deba tomarlos con la comida.  No beba leche ni tome anticidos al American Standard Companiesmismo tiempo que los suplementos de hierro, ya que estos pueden interferir en la absorcin del hierro.  Los suplementos de hierro pueden Investment banker, corporatecausar estreimiento. Para prevenirlo, asegrese de incluir fibras en su dieta. Posiblemente tambin se recomiende el uso de un ablandador de heces.  Tome las vitaminas segn las indicaciones del mdico.  Siga una dieta rica en hierro. Los alimentos con alto contenido de hierro incluyen hgado, carne magra, pan de grano integral, [place]vos, frutas deshidratadas y vegetales de hojas color verde oscuro. SOLICITE ATENCIN MDICA DE INMEDIATO SI:   Se desmaya. Si esto sucede, no conduzca. Comunquese con el servicio de emergencias de su localidad (911 en los [place]) si no dispone de [place] ayuda.  Siente dolor en el pecho.  Se siente nauseoso o vomita.  Presenta una dificultad respiratoria grave o en aumento al Union Pacific Corporationrealizar actividades.  Se siente dbil.  Tiene una frecuencia cardaca acelerada.  Comienza a sudar sin motivo.  Se  siente mareado cuando se levanta de una silla o de la cama. ASEGRESE DE QUE:   Comprende estas instrucciones.  Controlar su afeccin.  Recibir ayuda de inmediato si no mejora o si empeora.   Esta informacin no tiene Theme park managercomo fin reemplazar el consejo del mdico. Asegrese de hacerle al mdico cualquier pregunta que tenga.   Document Released: 07/02/2005 Document Revised: 07/23/2014 Elsevier Interactive Patient Education [employer]2016 Elsevier Inc.     IF you received an x-ray today, you will receive an [facility]. Please contact [facility] at [phone] with questions or concerns regarding your invoice.   IF you received labwork today, you will receive an invoice from [facility]. Please contact [facility] at [phone] with questions or concerns regarding your invoice.  Our billing staff will not be able to assist you with questions regarding bills from these companies.  You will be contacted with the lab results as soon as they are available. The fastest way to get your results is to activate your My Chart account. Instructions are located on the last page of this paperwork. If you have not heard from [place] regarding the results in 2 weeks, please contact this office.          Return in about 6 months (around 05/08/2016), or if symptoms worsen or fail to improve.   HOPPER,DAVID, MD 11/07/2015

## 2016-11-21 ENCOUNTER — Ambulatory Visit (INDEPENDENT_AMBULATORY_CARE_PROVIDER_SITE_OTHER): Payer: Worker's Compensation | Admitting: Emergency Medicine

## 2016-11-21 ENCOUNTER — Encounter: Payer: Self-pay | Admitting: Emergency Medicine

## 2016-11-21 ENCOUNTER — Ambulatory Visit (INDEPENDENT_AMBULATORY_CARE_PROVIDER_SITE_OTHER): Payer: Worker's Compensation

## 2016-11-21 VITALS — BP 106/70 | HR 70 | Temp 98.8°F | Resp 17 | Ht 68.5 in | Wt 162.0 lb

## 2016-11-21 DIAGNOSIS — S39012A Strain of muscle, fascia and tendon of lower back, initial encounter: Secondary | ICD-10-CM

## 2016-11-21 DIAGNOSIS — S3992XA Unspecified injury of lower back, initial encounter: Secondary | ICD-10-CM | POA: Diagnosis not present

## 2016-11-21 MED ORDER — DICLOFENAC SODIUM 75 MG PO TBEC: 75.0000 mg | DELAYED_RELEASE_TABLET | Freq: Two times a day (BID) | ORAL | 0 refills | Status: AC

## 2016-11-21 MED ORDER — CYCLOBENZAPRINE HCL 10 MG PO TABS: 10.0000 mg | ORAL_TABLET | Freq: Every day | ORAL | 0 refills | Status: DC

## 2016-11-21 NOTE — Patient Instructions (Addendum)
IF you received an x-ray today, you will receive an invoice from [facility]. Please contact [facility] at [phone] with questions or concerns regarding your invoice.   IF you received labwork today, you will receive an invoice from [facility]. Please contact [facility] at [phone] with questions or concerns regarding your invoice.   Our billing staff will not be able to assist you with questions regarding bills from these companies.  You will be contacted with the lab results as soon as they are available. The fastest way to get your results is to activate your My Chart account. Instructions are located on the last page of this paperwork. If you have not heard from us regarding the results in 2 weeks, please contact this office.      Dolor de espalda en adultos (Back Pain, Adult) El dolor de espalda es muy frecuente. A menudo mejora con el tiempo. La causa del dolor de espalda generalmente no es peligrosa. La mayora de las personas puede aprender a manejar el dolor de espalda por s mismas. CUIDADOS EN EL HOGAR Controle su dolor de espalda a fin de detectar algn cambio. Las siguientes indicaciones ayudarn a aliviar cualquier dolor que pueda sentir:  Mantngase activo. Comience con caminatas cortas sobre superficies planas si es posible. Trate de caminar un poco ms cada da.  Haga ejercicios con regularidad tal como le indic el mdico. El ejercicio ayuda a que su espalda se cure ms rpidamente. Tambin ayuda a prevenir futuras lesiones al mantener los msculos fuertes y flexibles.  No se siente, conduzca ni permanezca de pie durante ms de 30 minutos.  No permanezca en la cama. Si hace reposo ms de 1 a 2 das, puede demorar su recuperacin.  Sea cuidadoso al inclinarse o levantar un objeto. Use una tcnica apropiada para levantar peso:  Flexione las rodillas.  Mantenga el objeto cerca del cuerpo.  No gire.  Duerma sobre un colchn firme.  Recustese sobre un costado y flexione las rodillas. Si se recuesta sobre la espalda, coloque una almohada debajo de las rodillas.  Tome los medicamentos solamente como se lo haya indicado el mdico.  Aplique hielo sobre la zona lesionada.  Ponga el hielo en una bolsa plstica.  Coloque una toalla entre la piel y la bolsa de hielo.  Deje el hielo durante 20minutos, 2 a 3veces por da, durante los primeros 2 o 3das. Despus de eso, puede alternar entre compresas de hielo y calor.  Evite sentir ansiedad o estrs. Encuentre maneras efectivas de lidiar con el estrs, como hacer ejercicio.  Mantenga un peso saludable. El peso excesivo ejerce tensin sobre la espalda. SOLICITE AYUDA SI:  Siente dolor que no se alivia con reposo o medicamentos.  Siente cada vez ms dolor que se extiende a las piernas o los glteos.  El dolor no mejora en una semana.  Siente dolor por la noche.  Pierde peso.  Siente escalofros o fiebre. SOLICITE AYUDA DE INMEDIATO SI:  No puede controlar su materia fecal (heces) o el pis (orina).  Siente debilidad en las piernas o los brazos.  Siente prdida de la sensibilidad (adormecimiento) en las piernas o los brazos.  Tiene malestar estomacal (nuseas) o vomita.  Siente dolor de estmago (abdominal).  Siente que se desvanece (se desmaya). Esta informacin no tiene como fin reemplazar el consejo del mdico. Asegrese de hacerle al mdico cualquier pregunta que tenga. Document Released: 01/15/2011 Document Revised: 07/23/2014 Document Reviewed: 11/03/2013 Elsevier Interactive Patient Education  2017 Elsevier Inc.  

## 2016-11-21 NOTE — Progress Notes (Signed)
Cathy [name] 46 y.o.   No chief complaint on file.   HISTORY OF PRESENT ILLNESS: This is a 46 y.o. female complaining of low back pain since attempting to lift heavy load yesterday.  Back Pain  This is a new problem. The current episode started yesterday. The problem occurs constantly. The problem has been waxing and waning since onset. The pain is present in the lumbar spine. The quality of the pain is described as aching. The pain does not radiate. The pain is at a severity of 5/10. The pain is moderate. The symptoms are aggravated by position, bending and twisting. Pertinent negatives include no abdominal pain, bladder incontinence, bowel incontinence, chest pain, dysuria, leg pain, numbness, paresis, paresthesias, pelvic pain, perianal numbness, tingling or weakness. Risk factors: none.     Prior to Admission medications   Medication Sig Start Date End Date Taking? Authorizing Provider  Multiple Vitamin (MULTIVITAMIN WITH MINERALS) TABS tablet Take 1 tablet by mouth daily. Reported on 08/10/2015    [provider]    No Known Allergies  Patient Active Problem List   Diagnosis Date Noted  . Vertigo 08/10/2015    No past medical history on file.  Past Surgical History:  Procedure Laterality Date  . CESAREAN SECTION    . FootSurgery       Social History   Social History  . Marital status: Married    Spouse name: N/A  . Number of children: N/A  . Years of education: N/A   Occupational History  . Not on file.   Social History Main Topics  . Smoking status: Never Smoker  . Smokeless tobacco: Never Used  . Alcohol use No  . Drug use: No  . Sexual activity: Yes    Birth control/ protection: None   Other Topics Concern  . Not on file   Social History Narrative  . No narrative on file    Family History  Problem Relation Age of Onset  . Diabetes Mother   . Hyperlipidemia Mother      Review of Systems  Constitutional: Negative for chills.    Respiratory: Negative for cough and shortness of breath.   Cardiovascular: Negative for chest pain, palpitations and leg swelling.  Gastrointestinal: Negative for abdominal pain, blood in stool, bowel incontinence, constipation, melena, nausea and vomiting.  Genitourinary: Negative for bladder incontinence, dysuria, flank pain, hematuria, pelvic pain and urgency.  Musculoskeletal: Positive for back pain.  Skin: Negative for rash.  Neurological: Negative for dizziness, tingling, sensory change, focal weakness, weakness, numbness and paresthesias.  All other systems reviewed and are negative.    Physical Exam  Constitutional: She is oriented to person, place, and time. She appears well-developed and well-nourished.  HENT:  Head: Normocephalic and atraumatic.  Eyes: Conjunctivae and EOM are normal. Pupils are equal, round, and reactive to light.  Neck: Normal range of motion. Neck supple.  Cardiovascular: Normal rate, regular rhythm and normal heart sounds.   Pulmonary/Chest: Effort normal and breath sounds normal.  Abdominal: Soft. Bowel sounds are normal. She exhibits no distension. There is no tenderness.  Musculoskeletal:       Lumbar back: She exhibits decreased range of motion, tenderness, pain and spasm. She exhibits no bony tenderness and normal pulse.       Back:  Lymphadenopathy:    She has no cervical adenopathy.  Neurological: She is alert and oriented to person, place, and time. She displays normal reflexes. No sensory deficit. She exhibits normal muscle tone. Coordination normal.  Skin: Skin is warm and dry. Capillary refill takes less than 2 seconds. No rash noted.  Psychiatric: She has a normal mood and affect. Her behavior is normal.  Vitals reviewed.    ASSESSMENT & PLAN: Cathy [name] was seen today for back pain.  Diagnoses and all orders for this visit:  Injury of low back, initial encounter -     DG Lumbar Spine 2-3 Views; Future  Lumbar strain, initial  encounter  Other orders -     diclofenac (VOLTAREN) 75 MG EC tablet; Take 1 tablet (75 mg total) by mouth 2 (two) times daily. -     cyclobenzaprine (FLEXERIL) 10 MG tablet; Take 1 tablet (10 mg total) by mouth at bedtime.    Patient Instructions       IF you received an x-ray today, you will receive an invoice from [facility]. Please contact [facility] at [phone] with questions or concerns regarding your invoice.   IF you received labwork today, you will receive an invoice from [facility]. Please contact [facility] at [phone] with questions or concerns regarding your invoice.   Our billing staff will not be able to assist you with questions regarding bills from these companies.  You will be contacted with the lab results as soon as they are available. The fastest way to get your results is to activate your My Chart account. Instructions are located on the last page of this paperwork. If you have not heard from [place] regarding the results in 2 weeks, please contact this office.      Dolor de espalda en adultos (Back Pain, Adult) El dolor de espalda es muy frecuente. A menudo mejora con el tiempo. La causa del dolor de espalda generalmente no es peligrosa. La [employer] de las personas puede aprender a Runner, broadcasting/film/video de espalda por s mismas. CUIDADOS EN EL HOGAR Controle su dolor de espalda a fin de Public house manager cambio. Las siguientes indicaciones ayudarn a Psychologist, clinical que pueda sentir:  Materials engineer. Comience con caminatas cortas sobre superficies planas si es posible. Trate de caminar un poco ms cada da.  Haga ejercicios con regularidad tal como le indic el mdico. El ejercicio ayuda a que su espalda se cure ms rpidamente. Tambin ayuda a prevenir futuras lesiones al [employer] fuertes y flexibles.  No se siente, conduzca ni permanezca de pie durante ms de 30 minutos.  No permanezca en la cama. Si hace reposo ms de 1 a  2 das, puede demorar su recuperacin.  Sea cuidadoso al inclinarse o levantar un objeto. Use una tcnica apropiada para levantar peso:  Flexione las rodillas.  Mantenga el objeto cerca del cuerpo.  No gire.  Duerma sobre un [employer]. Recustese sobre un costado y flexione las rodillas. Si se recuesta [employer], coloque una almohada debajo de las rodillas.  Tome los medicamentos solamente como se lo haya indicado el mdico.  Aplique hielo sobre la zona lesionada.  Ponga el hielo en una bolsa plstica.  Coloque una toalla entre la piel y la bolsa de hielo.  Deje el hielo durante , 2 a 3veces por da, durante los primeros 2 o 3das. Despus de eso, puede alternar entre compresas de hielo y Airline pilot.  Evite sentir ansiedad o estrs. Encuentre maneras efectivas de lidiar con el estrs, Surveyor, mining ejercicio.  Mantenga un peso saludable. El peso excesivo ejerce tensin sobre la espalda. SOLICITE AYUDA SI:  Siente dolor que no se alivia con reposo o medicamentos.  Siente  cada vez ms dolor que se extiende a las piernas o los glteos.  El dolor no mejora en una semana.  Siente dolor por la noche.  Pierde peso.  Siente escalofros o fiebre. SOLICITE AYUDA DE INMEDIATO SI:  No puede controlar su materia fecal (heces) o el pis (orina).  Siente debilidad en las piernas o los brazos.  Siente prdida de la sensibilidad (adormecimiento) en las piernas o los brazos.  Tiene malestar estomacal (nuseas) o vomita.  Siente dolor de estmago (abdominal).  Siente que se desvanece (se desmaya). Esta informacin no tiene Theme park manager el consejo del mdico. Asegrese de hacerle al mdico cualquier pregunta que tenga. Document Released: 01/15/2011 Document Revised: 07/23/2014 Document Reviewed: 11/03/2013 Elsevier Interactive Patient Education  2017 Elsevier Inc.      Edwina Barth, MD [facility]

## 2016-11-26 ENCOUNTER — Ambulatory Visit (INDEPENDENT_AMBULATORY_CARE_PROVIDER_SITE_OTHER): Payer: Worker's Compensation | Admitting: Emergency Medicine

## 2016-11-26 ENCOUNTER — Encounter: Payer: Self-pay | Admitting: Emergency Medicine

## 2016-11-26 VITALS — BP 113/73 | HR 63 | Temp 98.0°F | Resp 17 | Ht 68.5 in | Wt 162.0 lb

## 2016-11-26 DIAGNOSIS — S3992XD Unspecified injury of lower back, subsequent encounter: Secondary | ICD-10-CM | POA: Diagnosis not present

## 2016-11-26 DIAGNOSIS — S39012D Strain of muscle, fascia and tendon of lower back, subsequent encounter: Secondary | ICD-10-CM | POA: Diagnosis not present

## 2016-11-26 NOTE — Progress Notes (Signed)
Cathy [name] 46 y.o.   Chief Complaint  Patient presents with  . Follow-up    Workmans comp/ back is doing better    HISTORY OF PRESENT ILLNESS: This is a 46 y.o. female seen by me 5/9 s/p WC back injury; doing better; no complaints or complications.  HPI   Prior to Admission medications   Medication Sig Start Date End Date Taking? Authorizing Provider  cyclobenzaprine (FLEXERIL) 10 MG tablet Take 1 tablet (10 mg total) by mouth at bedtime. 11/21/16  Yes Nevin Grizzle, Eilleen Kempf, MD  diclofenac (VOLTAREN) 75 MG EC tablet Take 1 tablet (75 mg total) by mouth 2 (two) times daily. 11/21/16 11/26/16 Yes Chales Pelissier, Eilleen Kempf, MD  Multiple Vitamin (MULTIVITAMIN WITH MINERALS) TABS tablet Take 1 tablet by mouth daily. Reported on 08/10/2015   Yes [provider]    No Known Allergies  Patient Active Problem List   Diagnosis Date Noted  . Lower back injury 11/21/2016  . Lumbar strain, initial encounter 11/21/2016  . Vertigo 08/10/2015    No past medical history on file.  Past Surgical History:  Procedure Laterality Date  . CESAREAN SECTION    . FootSurgery       Social History   Social History  . Marital status: Married    Spouse name: N/A  . Number of children: N/A  . Years of education: N/A   Occupational History  . Not on file.   Social History Main Topics  . Smoking status: Never Smoker  . Smokeless tobacco: Never Used  . Alcohol use No  . Drug use: No  . Sexual activity: Yes    Birth control/ protection: None   Other Topics Concern  . Not on file   Social History Narrative  . No narrative on file    Family History  Problem Relation Age of Onset  . Diabetes Mother   . Hyperlipidemia Mother      Review of Systems  Constitutional: Negative for chills and fever.  Respiratory: Negative for shortness of breath.   Cardiovascular: Negative for chest pain.  Gastrointestinal: Negative for abdominal pain, nausea and vomiting.  Genitourinary:  Negative for dysuria and hematuria.  Musculoskeletal: Positive for back pain (much improved). Negative for myalgias.  Skin: Negative for rash.  Neurological: Negative for dizziness, sensory change, focal weakness and headaches.  Endo/Heme/Allergies: Negative.   All other systems reviewed and are negative.  Vitals:   11/26/16 0948  BP: 113/73  Pulse: 63  Resp: 17  Temp: 98 F (36.7 C)     Physical Exam  Constitutional: She is oriented to person, place, and time. She appears well-developed and well-nourished.  HENT:  Head: Normocephalic and atraumatic.  Eyes: Pupils are equal, round, and reactive to light.  Neck: Normal range of motion.  Cardiovascular: Normal rate and regular rhythm.   Pulmonary/Chest: Effort normal and breath sounds normal.  Abdominal: Soft. There is no tenderness.  Musculoskeletal: Normal range of motion.       Lumbar back: Normal.  Back: no tenderness; FROM  Neurological: She is alert and oriented to person, place, and time. She displays normal reflexes. No sensory deficit. She exhibits normal muscle tone.  Skin: Skin is warm and dry. Capillary refill takes less than 2 seconds.  Psychiatric: She has a normal mood and affect. Her behavior is normal.  Vitals reviewed.    ASSESSMENT & PLAN: [name] was seen today for follow-up.  Diagnoses and all orders for this visit:  Lumbar strain, subsequent encounter  Injury of low back, subsequent encounter    Patient Instructions       IF you received an x-ray today, you will receive an invoice from [facility]. Please contact [facility] at [phone] with questions or concerns regarding your invoice.   IF you received labwork today, you will receive an invoice from [place]. Please contact [facility] at [phone] with questions or concerns regarding your invoice.   Our billing staff will not be able to assist you with questions regarding bills from these companies.  You will be  contacted with the lab results as soon as they are available. The fastest way to get your results is to activate your My Chart account. Instructions are located on the last page of this paperwork. If you have not heard from [place] regarding the results in 2 weeks, please contact this office.     Esguince lumbar con rehabilitacin (Low Back Sprain With Rehab) Un esguince se produce cuando se estiran las bandas de tejido que [employer] y las articulaciones (ligamentos). Los esguinces en la parte inferior de la espalda (columna lumbar) son Neomia Dear causa frecuente de dolor lumbar. Un esguince ocurre cuando los ligamentos se hiperdistienden o se estiran ms all de su lmite. Los ligamentos se pueden inflamar y, por lo tanto, Development worker, international aid dolor y Engineer, materials repentina del msculo (espasmos). La causa de un esguince puede ser Neomia Dear lesin (traumatismo), o se puede desarrollar gradualmente debido al [employer]. Hay tres tipos de esguince:  El grado1 es un esguince que involucra un ligamento hiperdistendido o un desgarro muy suave.  El grado2 es un esguince moderado que se caracteriza por un desgarro parcial del ligamento.  El grado3 es un esguince grave en el que se produjo un desgarro total del ligamento. CAUSAS Esta afeccin puede ser causada por lo siguiente:  Traumatismo, debido, por ejemplo, a una cada o a un golpe en el cuerpo.  Torsin o hiperdistensin de la espalda. Esto puede ser el resultado de realizar actividades que requieren mucha energa, como levantar objetos pesados. FACTORES DE RIESGO Los siguientes factores pueden aumentar el riesgo de sufrir esta afeccin:  Education administrator deportes de contacto.  Participar en deportes o actividades que sobrecargan la espalda e implican mucha flexin y torsin, por ejemplo:  Levantar pesas u objetos pesados.  Gimnasia.  Ftbol.  Patinaje artstico.  Snowboard.  Tener exceso de [place] u obesidad.  Tener poca fuerza y  flexibilidad. SNTOMAS Los sntomas de esta afeccin pueden incluir los siguientes:  Dolor agudo o sordo en la parte inferior de la espalda que no desaparece. El dolor se puede extender [employer].  Rigidez.  Amplitud de movimientos limitada.  Incapacidad para pararse derecho debido a la rigidez o al [employer].  Espasmos musculares. DIAGNSTICO Esta afeccin se puede diagnosticar en funcin de lo siguiente:  Sus sntomas.  Sus antecedentes mdicos.  Un examen fsico.  El mdico puede presionar sobre ciertas zonas de la espalda para determinar el origen de su dolor.  Le puede pedir que se incline [employer], [place] atrs y de un lado al otro para Development worker, community la intensidad del dolor y la amplitud de [place].  Pruebas de diagnstico por imgenes, como:  Radiografas.  Resonancia magntica (RM). TRATAMIENTO El tratamiento de esta afeccin puede incluir lo siguiente:  Corporate treasurer y fro en la zona afectada.  Tomar medicamentos para Engineer, materials y International aid/development worker los msculos (relajantes musculares).  Tomar antiinflamatorios no esteroides Murriel Hopper) para ayudar a Building services engineer y las molestias.  Realizar fisioterapia. Cuando los sntomas mejoran, es importante volver a su rutina habitual tan pronto como sea posible para reducir Chief Technology Officer, y Automotive engineer la rigidez y la prdida de fuerza muscular. En general, los sntomas deberan mejorar en 6semanas de tratamiento. Sin embargo, el tiempo de recuperacin vara. INSTRUCCIONES PARA EL CUIDADO EN EL HOGAR Control del dolor, de la rigidez y de la hinchazn  Si se lo indic el mdico, aplique hielo en la zona afectada durante las primeras 24horas despus de la lesin.  Ponga el hielo en una bolsa plstica.  Coloque una toalla entre la piel y la bolsa de hielo.  Coloque el hielo durante , 2 a 3veces por Futures trader.  Si se lo indican, aplique calor en la zona afectada tan frecuentemente como se lo haya indicado el mdico.  Use la fuente de calor que el mdico le recomiende, como una compresa de calor hmedo o una almohadilla trmica.  Coloque una toalla entre la piel y la fuente de Airline pilot.  Aplique el calor durante 20 a .  Retire la fuente de calor si la piel se le pone de color rojo brillante. Esto es muy importante si no puede sentir el dolor, el calor o el fro. Puede correr un riesgo mayor de sufrir quemaduras. Actividad  Descanse y retome sus actividades normales como se lo haya indicado el mdico. Pregntele al mdico qu actividades son seguras para usted.  Evite las actividades que demandan mucho esfuerzo (que son extenuantes) durante el tiempo que le haya indicado el mdico.  Haga ejercicios como se lo haya indicado el mdico. Instrucciones generales  [employer] medicamentos de venta libre y los recetados solamente como se lo haya indicado el mdico.  Si tiene alguna pregunta o inquietud sobre la seguridad mientras toma analgsicos, hable con el mdico.  No conduzca ni opere maquinaria pesada hasta saber cmo lo afectan los analgsicos.  No consuma ningn producto que contenga tabaco, lo que incluye cigarrillos, tabaco de Theatre manager y Administrator, Civil Service. El tabaco puede retrasar el proceso de curacin. Si necesita ayuda para dejar de fumar, consulte al mdico.  Concurra a todas las visitas de control como se lo haya indicado el mdico. Esto es importante. PREVENCIN  Precaliente y elongue adecuadamente antes de la [place].  Reljese y elongue despus de realizar [place].  Dele a su cuerpo tiempo para [employer] perodos de [place].  Evite lo siguiente:  Estar fsicamente inactivo durante perodos prolongados.  Hacer ejercicio o practicar deportes cuando est cansado o dolorido.  Practique deportes y levante objetos pesados de la forma correcta.  Adopte una buena postura mientras est sentado y de pie.  Mantenga un peso saludable.  Duerma en un colchn de  firmeza media para apoyar la columna.  Asegrese de Chemical engineer un equipo apto para usted, incluidos zapatos que [employer].  Tome medidas de seguridad y sea responsable al hacer Ether Griffins, para evitar las cadas.  Haga por lo menos de ejercicios de intensidad moderada cada semana, como caminar a paso ligero o hacer gimnasia acutica. Pruebe alguna forma de ejercicio que le quite tensin de la espalda, como nadar o Chemical engineer la bicicleta fija.  Mantenga un buen estado fsico, esto incluye lo siguiente:  La fuerza. En particular, desarrolle y mantenga msculos abdominales fuertes.  La flexibilidad.  La capacidad cardiovascular.  La resistencia. SOLICITE ATENCIN MDICA SI:  El dolor de espalda no mejora despus de 6semanas de [place].  Los sntomas empeoran. SOLICITE ATENCIN MDICA DE INMEDIATO SI:  El dolor de espalda es muy intenso.  Nota que no puede pararse o caminar.  Siente dolor en las piernas.  Siente debilidad en las nalgas o en las piernas.  Tiene problemas para Scientist, physiologicalcontrolar la miccin o los movimientos intestinales. Esta informacin no tiene Theme park managercomo fin reemplazar el consejo del mdico. Asegrese de hacerle al mdico cualquier pregunta que tenga. Document Released: 04/18/2006 Document Revised: 11/16/2014 Document Reviewed: 04/13/2015 Elsevier Interactive Patient Education  2017 Elsevier Inc.      Edwina BarthMiguel Destinie Thornsberry, MD [facility]

## 2016-11-26 NOTE — Patient Instructions (Addendum)
IF you received an x-ray today, you will receive an invoice from [facility]. Please contact [facility] at [phone] with questions or concerns regarding your invoice.   IF you received labwork today, you will receive an invoice from [place]. Please contact [facility] at [phone] with questions or concerns regarding your invoice.   Our billing staff will not be able to assist you with questions regarding bills from these companies.  You will be contacted with the lab results as soon as they are available. The fastest way to get your results is to activate your My Chart account. Instructions are located on the last page of this paperwork. If you have not heard from [place] regarding the results in 2 weeks, please contact this office.     Esguince lumbar con rehabilitacin (Low Back Sprain With Rehab) Un esguince se produce cuando se estiran las bandas de tejido que [employer] y las articulaciones (ligamentos). Los esguinces en la parte inferior de la espalda (columna lumbar) son Neomia Dear causa frecuente de dolor lumbar. Un esguince ocurre cuando los ligamentos se hiperdistienden o se estiran ms all de su lmite. Los ligamentos se pueden inflamar y, por lo tanto, Development worker, international aid dolor y Engineer, materials repentina del msculo (espasmos). La causa de un esguince puede ser Neomia Dear lesin (traumatismo), o se puede desarrollar gradualmente debido al [employer]. Hay tres tipos de esguince:  El grado1 es un esguince que involucra un ligamento hiperdistendido o un desgarro muy suave.  El grado2 es un esguince moderado que se caracteriza por un desgarro parcial del ligamento.  El grado3 es un esguince grave en el que se produjo un desgarro total del ligamento. CAUSAS Esta afeccin puede ser causada por lo siguiente:  Traumatismo, debido, por ejemplo, a una cada o a un golpe en el cuerpo.  Torsin o hiperdistensin de la espalda. Esto puede ser el resultado de realizar  actividades que requieren mucha energa, como levantar objetos pesados. FACTORES DE RIESGO Los siguientes factores pueden aumentar el riesgo de sufrir esta afeccin:  Education administrator deportes de contacto.  Participar en deportes o actividades que sobrecargan la espalda e implican mucha flexin y torsin, por ejemplo:  Levantar pesas u objetos pesados.  Gimnasia.  Ftbol.  Patinaje artstico.  Snowboard.  Tener exceso de [place] u obesidad.  Tener poca fuerza y flexibilidad. SNTOMAS Los sntomas de esta afeccin pueden incluir los siguientes:  Dolor agudo o sordo en la parte inferior de la espalda que no desaparece. El dolor se puede extender [employer].  Rigidez.  Amplitud de movimientos limitada.  Incapacidad para pararse derecho debido a la rigidez o al [employer].  Espasmos musculares. DIAGNSTICO Esta afeccin se puede diagnosticar en funcin de lo siguiente:  Sus sntomas.  Sus antecedentes mdicos.  Un examen fsico.  El mdico puede presionar sobre ciertas zonas de la espalda para determinar el origen de su dolor.  Le puede pedir que se incline Kellogg, [place] atrs y de un lado al otro para Development worker, community la intensidad del dolor y la amplitud de [place].  Pruebas de diagnstico por imgenes, como:  Radiografas.  Resonancia magntica (RM). TRATAMIENTO El tratamiento de esta afeccin puede incluir lo siguiente:  Corporate treasurer y fro en la zona afectada.  Tomar medicamentos para Engineer, materials y International aid/development worker los msculos (relajantes musculares).  Tomar antiinflamatorios no esteroides Murriel Hopper) para ayudar a Building services engineer y las molestias.  Realizar fisioterapia. Cuando los sntomas mejoran, es importante volver a su rutina habitual tan pronto  como sea posible para reducir Chief Technology Officer, y evitar la rigidez y la prdida de fuerza muscular. En general, los sntomas deberan mejorar en 6semanas de tratamiento. Sin embargo, el tiempo de recuperacin  vara. INSTRUCCIONES PARA EL CUIDADO EN EL HOGAR Control del dolor, de la rigidez y de la hinchazn  Si se lo indic el mdico, aplique hielo en la zona afectada durante las primeras 24horas despus de la lesin.  Ponga el hielo en una bolsa plstica.  Coloque una toalla entre la piel y la bolsa de hielo.  Coloque el hielo durante , 2 a 3veces por Futures trader.  Si se lo indican, aplique calor en la zona afectada tan frecuentemente como se lo haya indicado el mdico. Use la fuente de calor que el mdico le recomiende, como una compresa de calor hmedo o una almohadilla trmica.  Coloque una toalla entre la piel y la fuente de Airline pilot.  Aplique el calor durante 20 a .  Retire la fuente de calor si la piel se le pone de color rojo brillante. Esto es muy importante si no puede sentir el dolor, el calor o el fro. Puede correr un riesgo mayor de sufrir quemaduras. Actividad  Descanse y retome sus actividades normales como se lo haya indicado el mdico. Pregntele al mdico qu actividades son seguras para usted.  Evite las actividades que demandan mucho esfuerzo (que son extenuantes) durante el tiempo que le haya indicado el mdico.  Haga ejercicios como se lo haya indicado el mdico. Instrucciones generales  [employer] medicamentos de venta libre y los recetados solamente como se lo haya indicado el mdico.  Si tiene alguna pregunta o inquietud sobre la seguridad mientras toma analgsicos, hable con el mdico.  No conduzca ni opere maquinaria pesada hasta saber cmo lo afectan los analgsicos.  No consuma ningn producto que contenga tabaco, lo que incluye cigarrillos, tabaco de Theatre manager y Administrator, Civil Service. El tabaco puede retrasar el proceso de curacin. Si necesita ayuda para dejar de fumar, consulte al mdico.  Concurra a todas las visitas de control como se lo haya indicado el mdico. Esto es importante. PREVENCIN  Precaliente y elongue adecuadamente antes de  la [place].  Reljese y elongue despus de realizar [place].  Dele a su cuerpo tiempo para [employer] perodos de [place].  Evite lo siguiente:  Estar fsicamente inactivo durante perodos prolongados.  Hacer ejercicio o practicar deportes cuando est cansado o dolorido.  Practique deportes y levante objetos pesados de la forma correcta.  Adopte una buena postura mientras est sentado y de pie.  Mantenga un peso saludable.  Duerma en un colchn de firmeza media para apoyar la columna.  Asegrese de Chemical engineer un equipo apto para usted, incluidos zapatos que [employer].  Tome medidas de seguridad y sea responsable al hacer [employer] Griff[employer], para evitar las cadas.  Haga por lo menos de ejercicios de intensidad moderada cada semana, como caminar a paso ligero o hacer gimnasia acutica. Pruebe alguna forma de ejercicio que le quite tensin de la espalda, como nadar o Chemical engineer la bicicleta fija.  Mantenga un buen estado fsico, esto incluye lo siguiente:  La fuerza. En particular, desarrolle y mantenga msculos abdominales fuertes.  La flexibilidad.  La capacidad cardiovascular.  La resistencia. SOLICITE ATENCIN MDICA SI:  El dolor de espalda no mejora despus de 6semanas de [place].  Los sntomas empeoran. SOLICITE ATENCIN MDICA DE INMEDIATO SI:  El dolor de espalda es muy intenso.  Nota que no puede pararse o caminar.  Siente dolor en las piernas.  Siente debilidad en las nalgas o en las piernas.  Tiene problemas para Scientist, physiologicalcontrolar la miccin o los movimientos intestinales. Esta informacin no tiene Theme park managercomo fin reemplazar el consejo del mdico. Asegrese de hacerle al mdico cualquier pregunta que tenga. Document Released: 04/18/2006 Document Revised: 11/16/2014 Document Reviewed: 04/13/2015 Elsevier Interactive Patient Education  2017 [employer]

## 2020-01-13 ENCOUNTER — Other Ambulatory Visit: Payer: Self-pay

## 2020-01-13 ENCOUNTER — Ambulatory Visit: Payer: Self-pay | Admitting: Physician Assistant

## 2020-01-13 VITALS — BP 121/68 | HR 68 | Temp 98.7°F | Resp 18 | Ht 65.0 in

## 2020-01-13 DIAGNOSIS — G5602 Carpal tunnel syndrome, left upper limb: Secondary | ICD-10-CM

## 2020-01-13 DIAGNOSIS — R600 Localized edema: Secondary | ICD-10-CM

## 2020-01-13 MED ORDER — FUROSEMIDE 20 MG PO TABS
10.0000 mg | ORAL_TABLET | Freq: Every day | ORAL | 0 refills | Status: DC
Start: 2020-01-13 — End: 2022-06-18

## 2020-01-13 NOTE — Progress Notes (Signed)
New Patient Office Visit  Subjective:  Patient ID: Cathy [name], female    DOB: [birth year]  Age: 49 y.o. MRN: [id number]  CC:  Chief Complaint  Patient presents with  . Joint Swelling    HPI Cathy [name] reports that she has been having swelling and pressure in both ankles for the last 5 days.  Reports it is worse at night.  Reports that she has tried elevating her feet, submerging feet in cold water, using vapor rub without relief.  Reports this never happened to her before.  Reports that she does work on her feet.  Reports she has been having some intermittent numbness in her left hand for several months, does endorse that she works with her hands frequently.  Has not tried anything for relief  Reports sleep is good, no new medications, no changes to her diet, does follow a low-sodium diet.  Due to language barrier, an interpreter was present during the history-taking and subsequent discussion (and for part of the physical exam) with this patient.   History reviewed. No pertinent past medical history.  Past Surgical History:  Procedure Laterality Date  . CESAREAN SECTION    . FootSurgery       Family History  Problem Relation Age of Onset  . Diabetes Mother   . Hyperlipidemia Mother     Social History   Socioeconomic History  . Marital status: Married    Spouse name: Not on file  . Number of children: Not on file  . Years of education: Not on file  . Highest education level: Not on file  Occupational History  . Not on file  Tobacco Use  . Smoking status: Never Smoker  . Smokeless tobacco: Never Used  Substance and Sexual Activity  . Alcohol use: No  . Drug use: No  . Sexual activity: Yes    Birth control/protection: None  Other Topics Concern  . Not on file  Social History Narrative  . Not on file   Social Determinants of Health   Financial Resource Strain:   . Difficulty of Paying Living Expenses:   Food Insecurity:   . Worried About Community education officer in the Last Year:   . Barista in the Last Year:   Transportation Needs:   . Freight forwarder (Medical):   Marland Kitchen Lack of Transportation (Non-Medical):   Physical Activity:   . Days of Exercise per Week:   . Minutes of Exercise per Session:   Stress:   . Feeling of Stress :   Social Connections:   . Frequency of Communication with Friends and Family:   . Frequency of Social Gatherings with Friends and Family:   . Attends Religious Services:   . Active Member of Clubs or Organizations:   . Attends Banker Meetings:   Marland Kitchen Marital Status:   Intimate Partner Violence:   . Fear of Current or Ex-Partner:   . Emotionally Abused:   Marland Kitchen Physically Abused:   . Sexually Abused:     ROS Review of Systems  Constitutional: Negative.   HENT: Negative.   Eyes: Negative.   Respiratory: Negative for chest tightness, shortness of breath and wheezing.   Cardiovascular: Negative for chest pain.  Gastrointestinal: Negative.   Endocrine: Negative.   Musculoskeletal: Positive for joint swelling.  Skin: Negative.  Negative for color change.  Allergic/Immunologic: Negative.   Neurological: Negative for weakness and light-headedness.  Hematological: Negative.   Psychiatric/Behavioral: Negative.  Objective:   Today's Vitals: BP 121/68 (BP Location: Left Arm, Patient Position: Sitting, Cuff Size: Normal)   Pulse 68   Temp 98.7 F (37.1 C) (Oral)   Resp 18   Ht 5\' 5"  (1.651 m)   SpO2 100%   BMI 26.96 kg/m   Physical Exam Vitals and nursing note reviewed.  Constitutional:      Appearance: Normal appearance.  HENT:     Head: Normocephalic and atraumatic.     Right Ear: External ear normal.     Left Ear: External ear normal.     Nose: Nose normal.     Mouth/Throat:     Mouth: Mucous membranes are moist.     Pharynx: Oropharynx is clear.  Eyes:     Extraocular Movements: Extraocular movements intact.     Conjunctiva/sclera: Conjunctivae normal.      Pupils: Pupils are equal, round, and reactive to light.  Cardiovascular:     Rate and Rhythm: Normal rate and regular rhythm.     Pulses: Normal pulses.          Dorsalis pedis pulses are 2+ on the right side and 2+ on the left side.     Heart sounds: Normal heart sounds.  Pulmonary:     Effort: Pulmonary effort is normal.     Breath sounds: Normal breath sounds.  Abdominal:     General: Abdomen is flat. Bowel sounds are normal.     Palpations: Abdomen is soft.  Musculoskeletal:     Right hand: Normal.     Left hand: No tenderness. Normal range of motion. Normal strength. Normal sensation.     Cervical back: Normal range of motion and neck supple.     Right lower leg: 1+ Pitting Edema present.     Left lower leg: 1+ Pitting Edema present.  Skin:    General: Skin is warm and dry.  Neurological:     General: No focal deficit present.     Mental Status: She is alert and oriented to person, place, and time.  Psychiatric:        Mood and Affect: Mood normal.        Behavior: Behavior normal.        Thought Content: Thought content normal.        Judgment: Judgment normal.     Assessment & Plan:   Problem List Items Addressed This Visit    None    Visit Diagnoses    Bilateral lower extremity edema    -  Primary   Relevant Medications   furosemide (LASIX) 20 MG tablet   Other Relevant Orders   CBC with Differential/Platelet (Completed)   Comp. Metabolic Panel (12) (Completed)   TSH (Completed)   Carpal tunnel syndrome of left wrist          Outpatient Encounter Medications as of 01/13/2020  Medication Sig  . cyclobenzaprine (FLEXERIL) 10 MG tablet Take 1 tablet (10 mg total) by mouth at bedtime.  . furosemide (LASIX) 20 MG tablet Take 0.5 tablets (10 mg total) by mouth daily.  . Multiple Vitamin (MULTIVITAMIN WITH MINERALS) TABS tablet Take 1 tablet by mouth daily. Reported on 08/10/2015   No facility-administered encounter medications on file as of 01/13/2020.  1.  Bilateral lower extremity edema Trial furosemide 10 mg, gave patient education on edema.  Patient to return to mobile unit in 1 week for further evaluation.  Patient will be given appointment to establish primary care at next office visit - furosemide (  LASIX) 20 MG tablet; Take 0.5 tablets (10 mg total) by mouth daily.  Dispense: 30 tablet; Refill: 0 - CBC with Differential/Platelet - Comp. Metabolic Panel (12) - TSH  2. Carpal tunnel syndrome of left wrist Patient education given on carpal tunnel, encouraged over-the-counter ibuprofen, gentle stretching, appropriate bracing.   I have reviewed the patient's medical history (PMH, PSH, Social History, Family History, Medications, and allergies) , and have been updated if relevant. I spent 30 minutes reviewing chart and  face to face time with patient.      Follow-up: Return in about 1 week (around 01/20/2020).   Kasandra Knudsen Mayers, PA-C

## 2020-01-13 NOTE — Patient Instructions (Addendum)
Edema Edema  El edema es una acumulacin anormal de lquido en los tejidos del cuerpo y debajo de la piel. La hinchazn de las piernas, los pies y los tobillos es un sntoma frecuente que se hace ms probable a medida que envejece. La hinchazn tambin es frecuente en los tejidos ms blandos, como en la zona que rodea los ojos. Cuando se comprime la zona afectada, el lquido puede moverse de Nature conservation officer y es posible que quede una depresin durante unos instantes. Esta depresin se conoce como edema. Hay muchas causas posibles de edema. Consumir grandes cantidades de sal (sodio) y estar de pie o sentado durante mucho tiempo puede causar edema en las piernas, los pies y los tobillos. Cuando hace calor, el edema puede empeorar. Entre las causas frecuentes de edema se incluyen las siguientes:  Insuficiencia cardaca.  Enfermedad heptica o renal.  Debilidad de los vasos sanguneos de las piernas.  Cncer.  Una lesin.  Embarazo.  Medicamentos.  Ser obeso.  Bajos niveles de protenas en la sangre. Generalmente, el edema es indoloro. La piel puede parecer hinchada o tener un aspecto brilloso. Siga estas indicaciones en su casa:  Cuando est sentado o acostado, mantenga la parte del cuerpo afectada (elevada) por encima del nivel del corazn.  No permanezca quieto sentado o de pie durante largos perodos.  No use ropa ajustada. No use ligas en la parte superior de las piernas.  Ejercite las piernas para Publishing rights manager. Esto ayuda a que el lquido pase nuevamente a sus vasos sanguneos, y puede ayudar a Building services engineer.  Use vendas o medias elsticas para reducir la hinchazn segn las indicaciones del mdico.  Siga una dieta con poco contenido de sal (sodio) para reducir el lquido segn las indicaciones del mdico.  Dependiendo de la causa de su hinchazn, es posible que deba limitar la cantidad de lquido que bebe (restriccin de lquido).  Tome los medicamentos de venta  libre y los recetados solamente como se lo haya indicado el mdico. Comunquese con un mdico si:  El edema no mejora con Scientist, research (medical).  Tiene enfermedades cardacas, hepticas o renales, y observa sntomas de edema.  Aumenta de peso de [place] repentina y sin motivo aparente. Solicite ayuda de inmediato si:  Siente falta de aire o dolor en el pecho.  No puede respirar cuando se acuesta.  Tiene dolor, enrojecimiento o calor en las zonas hinchadas.  Tiene enfermedades cardacas, hepticas o renales y repentinamente tiene edema.  Tiene fiebre, y los sntomas empeoran repentinamente. Resumen  El edema es una acumulacin anormal de lquido en los tejidos del cuerpo y debajo de la piel.  Consumir grandes cantidades de sal (sodio) y estar de pie o sentado durante mucho tiempo puede causar edema en las piernas, los pies y los tobillos.  Cuando est sentado o acostado, mantenga la parte del cuerpo afectada (elevada) por encima del nivel del corazn. Esta informacin no tiene Theme park manager el consejo del mdico. Asegrese de hacerle al mdico cualquier pregunta que tenga. Document Revised: 02/11/2017 Document Reviewed: 02/11/2017 Elsevier Patient Education  2020 Elsevier Inc.   Sndrome del tnel carpiano Carpal Tunnel Syndrome  El sndrome del tnel carpiano es una afeccin que causa dolor en la mano y en el brazo. El tnel carpiano es un rea estrecha ubicada en el lado palmar de la [place]. Los movimientos repetidos de la mueca o determinadas enfermedades pueden causar la hinchazn del tnel. Esta hinchazn comprime el nervio principal de la mueca (nervio mediano).  Cules son las causas? Esta afeccin puede ser causada por lo siguiente:  Movimientos repetidos de [employer]  Lesiones en la [place].  Artritis.  Un quiste o un tumor en el tnel carpiano.  Acumulacin de lquido Academic librarian. A veces, se desconoce la causa de esta afeccin. Qu incrementa el  riesgo? Los siguientes factores pueden hacer que sea ms propenso a Clinical cytogeneticist afeccin:  Tener un trabajo que requiera mover repetidamente la [place] del mismo modo, por ejemplo, de carnicero o cajero.  Ser mujer.  Tener ciertas afecciones, tales como: ? Diabetes. ? Obesidad. ? Tiroidea hipoactiva (hipotiroidismo). ? Insuficiencia renal. Cules son los signos o sntomas? Los sntomas de esta afeccin incluyen:  Sensacin de hormigueo en los dedos de la mano, especialmente el pulgar, el ndice y el dedo [place].  Hormigueo o adormecimiento en la mano.  Sensacin de Engineer, mining en todo el brazo, especialmente cuando la [place] y el codo estn flexionados durante [place].  Dolor en la mueca que sube por el brazo hasta el hombro.  Dolor que baja hasta la palma de la mano o los dedos.  Sensacin de [employer]. Tal vez tenga dificultad para tomar y Personal assistant. Los sntomas pueden empeorar durante la noche. Cmo se diagnostica? Esta afeccin se diagnostica mediante los antecedentes mdicos y un examen fsico. Tambin pueden hacerle estudios, que incluyen los siguientes:  Physiological scientist electromiograma (EMG). Esta prueba mide las seales elctricas que los nervios les envan a los msculos.  Estudio de [employer]. Este estudio permite determinar si las seales elctricas pasan correctamente por los nervios.  Estudios de diagnstico por imgenes, como radiografas, una ecografa y Hotel manager (RM). Estos estudios Education officer, community las posibles causas de la afeccin. Cmo se trata? El tratamiento de esta afeccin puede incluir:  Cambios en el estilo de vida. Es importante que deje o cambie la actividad que caus la afeccin.  Hacer ejercicio y actividades para fortalecer los msculos y los huesos (fisioterapia).  Aprender a usar la mano nuevamente despus del diagnstico (terapia ocupacional).  Analgsicos y antiinflamatorios. Esto puede incluir  medicamentos que se inyectan en la [place].  Una frula para la [place].  [place]. Siga estas indicaciones en su casa: Si tiene una frula:  Use la frula como se lo haya indicado el mdico. Qutesela solamente como se lo haya indicado el mdico.  Afloje la frula si los dedos se le adormecen, siente hormigueo o se le enfran y se tornan de Research officer, trade union.  Mantenga la frula limpia.  Si la frula no es impermeable: ? No deje que se moje. ? [place]brala con un envoltorio hermtico cuando tome un bao de inmersin o [place]. Control del dolor, la rigidez y la hinchazn   Si se lo indican, aplique hielo sobre la zona del dolor: ? Si tiene una frula desmontable, qutesela como se lo haya indicado el mdico. ? Ponga el hielo en una bolsa plstica. ? Coloque una [employer] piel y Copy. ? Coloque el hielo durante , 2a3veces al da. Indicaciones generales  [employer] de venta libre y los recetados solamente como se lo haya indicado el mdico.  Descanse la [place] de toda actividad que le cause dolor. Si la afeccin tiene relacin con [place]ie [place], hable con su empleador [employer] pueden [place], por [place], usar una almohadilla para apoyar la mueca mientras tipea.  Haga los ejercicios como se lo hayan indicado el mdico, el fisioterapeuta o el terapeuta  ocupacional.  Concurra a todas las visitas de seguimiento como se lo haya indicado el mdico. Esto es importante. Comunquese con un mdico si:  Aparecen nuevos sntomas.  El dolor no se alivia con los [employer].  Sus sntomas empeoran. Solicite ayuda de inmediato si:  Tiene hormigueo o adormecimiento intensos en la mueca o la mano. Resumen  El sndrome del tnel carpiano es una afeccin que causa dolor en la mano y en el brazo.  Generalmente se debe a movimientos repetidos de [employer].  El sndrome del tnel carpiano se trata mediante cambios en el estilo de vida y medicamentos.  Tambin puede indicarse la [place].  Siga las indicaciones del mdico sobre el uso de una frula, el descanso de la [place], la asistencia a las visitas de seguimiento y Freight forwarder para pedir ayuda. Esta informacin no tiene Theme park manager el consejo del mdico. Asegrese de hacerle al mdico cualquier pregunta que tenga. Document Revised: 12/05/2017 Document Reviewed: 12/05/2017 Elsevier Patient Education  2020 [employer].

## 2020-01-14 LAB — COMP. METABOLIC PANEL (12)
AST: 18 IU/L (ref 0–40)
Albumin/Globulin Ratio: 1.5 (ref 1.2–2.2)
Albumin: 4.1 g/dL (ref 3.8–4.8)
Alkaline Phosphatase: 58 IU/L (ref 48–121)
BUN/Creatinine Ratio: 14 (ref 9–23)
BUN: 11 mg/dL (ref 6–24)
Bilirubin Total: 0.2 mg/dL (ref 0.0–1.2)
Calcium: 8.9 mg/dL (ref 8.7–10.2)
Chloride: 103 mmol/L (ref 96–106)
Creatinine, Ser: 0.8 mg/dL (ref 0.57–1.00)
GFR calc Af Amer: 99 mL/min/{1.73_m2} (ref >59)
GFR calc non Af Amer: 86 mL/min/{1.73_m2} (ref >59)
Globulin, Total: 2.8 g/dL (ref 1.5–4.5)
Glucose: 90 mg/dL (ref 65–99)
Potassium: 3.9 mmol/L (ref 3.5–5.2)
Sodium: 140 mmol/L (ref 134–144)
Total Protein: 6.9 g/dL (ref 6.0–8.5)

## 2020-01-14 LAB — CBC WITH DIFFERENTIAL/PLATELET
Basophils Absolute: 0 10*3/uL (ref 0.0–0.2)
Basos: 1 %
EOS (ABSOLUTE): 0.1 10*3/uL (ref 0.0–0.4)
Eos: 2 %
Hematocrit: 35.9 % (ref 34.0–46.6)
Hemoglobin: 12 g/dL (ref 11.1–15.9)
Immature Grans (Abs): 0 10*3/uL (ref 0.0–0.1)
Immature Granulocytes: 0 %
Lymphocytes Absolute: 1.8 10*3/uL (ref 0.7–3.1)
Lymphs: 31 %
MCH: 30.4 pg (ref 26.6–33.0)
MCHC: 33.4 g/dL (ref 31.5–35.7)
MCV: 91 fL (ref 79–97)
Monocytes Absolute: 0.3 10*3/uL (ref 0.1–0.9)
Monocytes: 6 %
Neutrophils Absolute: 3.4 10*3/uL (ref 1.4–7.0)
Neutrophils: 60 %
Platelets: 195 10*3/uL (ref 150–450)
RBC: 3.95 x10E6/uL (ref 3.77–5.28)
RDW: 13.1 % (ref 11.7–15.4)
WBC: 5.6 10*3/uL (ref 3.4–10.8)

## 2020-01-14 LAB — TSH: TSH: 2.23 u[IU]/mL (ref 0.450–4.500)

## 2020-01-19 ENCOUNTER — Telehealth: Payer: Self-pay | Admitting: *Deleted

## 2020-01-19 NOTE — Telephone Encounter (Signed)
-----   Message from Roney Jaffe, [place] sent at 01/14/2020 11:07 AM EDT ----- Please call patient and let her know her  kidney, liver, thyroid function within normal limits.  Encourage return to mobile unit in 1 week for follow-up of edema.

## 2020-01-19 NOTE — Telephone Encounter (Signed)
Medical Assistant used [employer] to contact patient.  Interpreter Name: Rosalie Gums #: [id number] Medical Assistant left message on patient's home and cell voicemail. Voicemail states to give a call back to [place] with [facility] at [phone].

## 2020-01-20 ENCOUNTER — Ambulatory Visit: Payer: Self-pay

## 2020-08-29 ENCOUNTER — Ambulatory Visit: Payer: Self-pay

## 2020-09-17 ENCOUNTER — Other Ambulatory Visit: Payer: Self-pay

## 2020-09-17 ENCOUNTER — Emergency Department (HOSPITAL_COMMUNITY): Payer: Self-pay

## 2020-09-17 ENCOUNTER — Emergency Department (HOSPITAL_COMMUNITY)
Admission: EM | Admit: 2020-09-17 | Discharge: 2020-09-17 | Disposition: A | Discharge status: Home / Self Care | Payer: Self-pay | Attending: Emergency Medicine | Admitting: Emergency Medicine

## 2020-09-17 DIAGNOSIS — W19XXXA Unspecified fall, initial encounter: Secondary | ICD-10-CM | POA: Insufficient documentation

## 2020-09-17 DIAGNOSIS — X501XXA Overexertion from prolonged static or awkward postures, initial encounter: Secondary | ICD-10-CM | POA: Insufficient documentation

## 2020-09-17 DIAGNOSIS — S93401A Sprain of unspecified ligament of right ankle, initial encounter: Secondary | ICD-10-CM | POA: Insufficient documentation

## 2020-09-17 NOTE — ED Notes (Signed)
ED Provider at bedside. 

## 2020-09-17 NOTE — ED Provider Notes (Signed)
[facility]-EMERGENCY DEPT Provider Note   CSN: [id number] Arrival date & time: 09/17/20  0915     History Chief Complaint  Patient presents with  . Ankle Pain    Cathy [name] is a 50 y.o. female.  HPI   Patient is a [place] speaker and a Spanish interpreter is used to collect HPI.  Patient with no significant medical history presents to the emergency department with chief complaint of right ankle pain.  Patient endorses 3 days ago she had a mechanical fall and landed onto her right side.  She endorses when she fell her ankle bent outwards and she had pain on her medial malleolus as well as her proximal fifth metatarsal.  She states she was unable to ambulate on the first day but on day 2 and 3 she was able to walk on it with some pain.  She denies numbness or tingling in her foot, states she is able to move her toes and ankles without difficulty.  She denies alleviating factors.  Patient denies hitting her head, losing conscious, is not on anticoagulant.  Patient denies headaches, fevers, chills, shortness of breath, chest pain, abdominal pain, nausea, vomiting, diarrhea, worsening pedal edema.  No past medical history on file.  Patient Active Problem List   Diagnosis Date Noted  . Lower back injury 11/21/2016  . Lumbar strain, initial encounter 11/21/2016  . Vertigo 08/10/2015    Past Surgical History:  Procedure Laterality Date  . CESAREAN SECTION    . FootSurgery        OB History    Gravida  7   Para  5   Term  5   Preterm      AB  2   Living  5     SAB  2   IAB      Ectopic      Multiple      Live Births              Family History  Problem Relation Age of Onset  . Diabetes Mother   . Hyperlipidemia Mother     Social History   Tobacco Use  . Smoking status: Never Smoker  . Smokeless tobacco: Never Used  Substance Use Topics  . Alcohol use: No  . Drug use: No    Home Medications Prior to Admission  medications   Medication Sig Start Date End Date Taking? Authorizing Provider  cyclobenzaprine (FLEXERIL) 10 MG tablet Take 1 tablet (10 mg total) by mouth at bedtime. 11/21/16   Georgina Quint, MD  furosemide (LASIX) 20 MG tablet Take 0.5 tablets (10 mg total) by mouth daily. 01/13/20   Mayers, Cari S, PA-C  Multiple Vitamin (MULTIVITAMIN WITH MINERALS) TABS tablet Take 1 tablet by mouth daily. Reported on 08/10/2015    [provider]    Allergies    Patient has no known allergies.  Review of Systems   Review of Systems  Constitutional: Negative for chills and fever.  HENT: Negative for congestion.   Respiratory: Negative for shortness of breath.   Cardiovascular: Negative for chest pain.  Gastrointestinal: Negative for abdominal pain.  Genitourinary: Negative for enuresis.  Musculoskeletal: Negative for back pain.       Right ankle pain.  Skin: Negative for rash.  Neurological: Negative for headaches.    Physical Exam Updated Vital Signs BP 127/74   Pulse 77   Temp 97.6 F (36.4 C) (Oral)   Resp 16   SpO2 100%  Physical Exam Vitals and nursing note reviewed.  Constitutional:      General: She is not in acute distress.    Appearance: She is not ill-appearing.  HENT:     Head: Normocephalic and atraumatic.     Nose: No congestion.  Eyes:     Conjunctiva/sclera: Conjunctivae normal.  Cardiovascular:     Rate and Rhythm: Normal rate and regular rhythm.     Pulses: Normal pulses.  Pulmonary:     Effort: Pulmonary effort is normal.  Musculoskeletal:        General: Tenderness present. No swelling.     Right lower leg: No edema.     Left lower leg: No edema.     Comments: Patient's right ankle was visualized, it was nonerythematous, no edema present.  She had full range motion at her toes, ankle, knee.  She is slightly tender to palpation along the fifth metatarsal no deformity or crepitus present, she also tender along her medial malleolus no deformities  present.  Neurovascular fully intact.  Skin:    General: Skin is warm and dry.  Neurological:     Mental Status: She is alert.  Psychiatric:        Mood and Affect: Mood normal.     ED Results / Procedures / Treatments   Labs (all labs ordered are listed, but only abnormal results are displayed) Labs Reviewed - No data to display  EKG None  Radiology DG Ankle Complete Right  Result Date: 09/17/2020 CLINICAL DATA:  Medial malleolus tenderness after fall EXAM: RIGHT ANKLE - COMPLETE 3+ VIEW COMPARISON:  None. FINDINGS: Medial and lateral malleolus hardware is intact without periprosthetic fracture or lucency. No acute abnormality of the right ankle. Soft tissues normal. IMPRESSION: No acute abnormality of the right ankle. Electronically Signed   By: Acquanetta Belling M.D.   On: 09/17/2020 10:32   DG Foot Complete Right  Result Date: 09/17/2020 CLINICAL DATA:  Medial malleolus tenderness EXAM: RIGHT FOOT COMPLETE - 3+ VIEW COMPARISON:  None. FINDINGS: No fracture or dislocation. Visualized ankle hardware is unremarkable. Soft tissues unremarkable. IMPRESSION: No significant abnormality of the right foot. Electronically Signed   By: Acquanetta Belling M.D.   On: 09/17/2020 10:34    Procedures Procedures   Medications Ordered in ED Medications - No data to display  ED Course  I have reviewed the triage vital signs and the nursing notes.  Pertinent labs & imaging results that were available during my care of the patient were reviewed by me and considered in my medical decision making (see chart for details).    MDM Rules/Calculators/A&P                         Initial impression-patient presents with right ankle pain.  She is alert, does not appear in acute distress, vital signs reassuring.  Will obtain imaging for further evaluation.  Work-up-x-ray of right ankle and foot negative for acute findings.  Rule out-  Low suspicion for fracture or dislocation as x-ray does not feel any  significant findings. low suspicion for ligament or tendon damage as area was palpated no gross defects noted, she had full range of motion at her toes and ankle.  Low suspicion for compartment syndrome as area was palpated it was soft to the touch, neurovascular fully intact.  Plan-suspect patient suffering from a ligament strain versus muscular strain.  Will provide patient with a brace recommend over-the-counter pain medications follow-up with Ortho in  1 to 2 weeks symptoms not fully resolved.  Vital signs have remained stable, no indication for hospital admission. Patient given at home care as well strict return precautions.  Patient verbalized that they understood agreed to said plan.   Final Clinical Impression(s) / ED Diagnoses Final diagnoses:  Sprain of right ankle, unspecified ligament, initial encounter    Rx / DC Orders ED Discharge Orders    None       Carroll Sage, PA-C 09/17/20 1214    Lorre Nick, MD 09/21/20 1016

## 2020-09-17 NOTE — ED Triage Notes (Signed)
4 days ago onset of right ankle pain that occurred after a fall. (+) mild swelling/ pain. No bruising or deformity noted.

## 2020-09-17 NOTE — Discharge Instructions (Addendum)
You have been seen here for right ankle pain.  I have given you a splint please wear during the day, you may take off at nighttime.  I recommend taking over-the-counter pain medications like ibuprofen and/or Tylenol every 6 as needed.  Please follow dosage and on the back of bottle.  I also recommend applying heat to the area and stretching out the muscles as this will help decrease stiffness and pain.  I have given you information on exercises please follow.  You may follow-up with orthopedic surgery in 1 to 2 weeks if symptoms not fully resolved.  Come back to the emergency department if you develop chest pain, shortness of breath, severe abdominal pain, uncontrolled nausea, vomiting, diarrhea.

## 2021-05-29 ENCOUNTER — Ambulatory Visit (HOSPITAL_COMMUNITY)
Admission: EM | Admit: 2021-05-29 | Discharge: 2021-05-29 | Disposition: A | Discharge status: Home / Self Care | Payer: Self-pay | Attending: Emergency Medicine | Admitting: Emergency Medicine

## 2021-05-29 ENCOUNTER — Encounter (HOSPITAL_COMMUNITY): Payer: Self-pay

## 2021-05-29 ENCOUNTER — Other Ambulatory Visit: Payer: Self-pay

## 2021-05-29 DIAGNOSIS — M542 Cervicalgia: Secondary | ICD-10-CM

## 2021-05-29 DIAGNOSIS — R42 Dizziness and giddiness: Secondary | ICD-10-CM

## 2021-05-29 MED ORDER — MECLIZINE HCL 25 MG PO TABS
25.0000 mg | ORAL_TABLET | Freq: Three times a day (TID) | ORAL | 0 refills | Status: DC | PRN
Start: 2021-05-29 — End: 2022-06-18

## 2021-05-29 MED ORDER — MELOXICAM 7.5 MG PO TABS: 7.5000 mg | ORAL_TABLET | Freq: Every day | ORAL | 0 refills | Status: DC

## 2021-05-29 MED ORDER — PREDNISONE 20 MG PO TABS: 40.0000 mg | ORAL_TABLET | Freq: Every day | ORAL | 0 refills | Status: DC

## 2021-05-29 NOTE — Discharge Instructions (Addendum)
Take prednisone every morning for 5 days days, while using may use tylenol throughout the day for comfort   After completion of steroid take meloxicam every morning for 5 days then as needed   Your pain is most likely caused by irritation to the muscles or ligaments of your neck  You may use heating pad in 15 minute intervals as needed for additional comfort  Begin stretching affected area daily for 10 minutes as tolerated to further loosen muscles   When lying down place pillow underneath and between knees for support  Can try sleeping without pillow on firm mattress   Practice good posture: head back, shoulders back, chest forward, pelvis back and weight distributed evenly on both legs  If pain persist after recommended treatment or reoccurs if may be beneficial to follow up with orthopedic specialist for evaluation, this doctor specializes in the bones and can manage your symptoms long-term with options such as but not limited to imaging, medications or physical therapy   For your dizziness  Take meclizine three times a day as needed  Follow up with your primary doctor if symptoms persist  Change positions slowly, pausing for at least 30 seconds before moving

## 2021-05-29 NOTE — ED Triage Notes (Signed)
Pt reports "bone pain: in shoulder, hands and knees x 2 months; dizziness when standing and when laying down. Voltaren gel and Tylenol gives some relief.

## 2021-05-29 NOTE — ED Provider Notes (Signed)
[facility]    CSN: [id number] Arrival date & time: 05/29/21  0805      History   Chief Complaint Chief Complaint  Patient presents with   Shoulder Pain   Dizziness    HPI Cathy [name] is a 50 y.o. female.   Patient presents with left-sided neck pain that radiates down arms and into hands, fingers for 2 months.  Endorses tingling within the fingers and hands, denies numbness.  Endorses that over the last week pain has intensified in the bilateral shoulders feeling as if it is as her bones.  Able to fully move her shoulders and arms, able to fully move neck.  Pain can be felt with extension of neck.  Patient concerned with dizziness for 6 days, worse with movement symptoms resolve when she lies down.  Symptoms are also worsened with changing positions.  Denies lightheadedness, syncope, headaches, blurred vision, weakness, slurred speech.  History of vertigo.  Not currently taking medications.   History reviewed. No pertinent past medical history.  Patient Active Problem List   Diagnosis Date Noted   Lower back injury 11/21/2016   Lumbar strain, initial encounter 11/21/2016   Vertigo 08/10/2015    Past Surgical History:  Procedure Laterality Date   CESAREAN SECTION     FootSurgery       OB History     Gravida  7   Para  5   Term  5   Preterm      AB  2   Living  5      SAB  2   IAB      Ectopic      Multiple      Live Births               Home Medications    Prior to Admission medications   Medication Sig Start Date End Date Taking? Authorizing Provider  cyclobenzaprine (FLEXERIL) 10 MG tablet Take 1 tablet (10 mg total) by mouth at bedtime. 11/21/16   Georgina Quint, MD  furosemide (LASIX) 20 MG tablet Take 0.5 tablets (10 mg total) by mouth daily. 01/13/20   Mayers, Cari S, PA-C  Multiple Vitamin (MULTIVITAMIN WITH MINERALS) TABS tablet Take 1 tablet by mouth daily. Reported on 08/10/2015    [provider]     Family History Family History  Problem Relation Age of Onset   Diabetes Mother    Hyperlipidemia Mother     Social History Social History   Tobacco Use   Smoking status: Never   Smokeless tobacco: Never  Substance Use Topics   Alcohol use: No   Drug use: No     Allergies   Patient has no known allergies.   Review of Systems Review of Systems  Constitutional: Negative.   Respiratory: Negative.    Cardiovascular: Negative.   Musculoskeletal:  Positive for arthralgias and neck pain. Negative for back pain, gait problem, joint swelling, myalgias and neck stiffness.  Skin: Negative.   Neurological:  Positive for dizziness. Negative for tremors, seizures, syncope, facial asymmetry, speech difficulty, weakness, light-headedness, numbness and headaches.    Physical Exam Triage Vital Signs ED Triage Vitals  Enc Vitals Group     BP 05/29/21 0839 129/79     Pulse --      Resp 05/29/21 0839 18     Temp 05/29/21 0839 99.2 F (37.3 C)     Temp Source 05/29/21 0839 Oral     SpO2 05/29/21 0839 97 %  Weight --      Height --      Head Circumference --      Peak Flow --      Pain Score 05/29/21 0838 8     Pain Loc --      Pain Edu? --      Excl. in [place]? --    No data found.  Updated Vital Signs BP 129/79 (BP Location: Right Arm)   Temp 99.2 F (37.3 C) (Oral)   Resp 18   SpO2 97%   Visual Acuity Right Eye Distance:   Left Eye Distance:   Bilateral Distance:    Right Eye Near:   Left Eye Near:    Bilateral Near:     Physical Exam Constitutional:      Appearance: She is normal weight.  HENT:     Head: Normocephalic.  Eyes:     Extraocular Movements: Extraocular movements intact.  Neck:     Comments: Diffuse tenderness along the left lateral neck, no point tenderness, ROM of neck intact, no crepitus, rigidity, spinal tenderness noted   ROM of bilateral shoulder intact, no point tenderness of shoulders, negative Hawking's sign  Pulmonary:      Effort: Pulmonary effort is normal.  Skin:    General: Skin is warm and dry.  Neurological:     General: No focal deficit present.     Mental Status: She is alert and oriented to person, place, and time. Mental status is at baseline.     Cranial Nerves: No cranial nerve deficit.     Motor: No weakness.  Psychiatric:        Mood and Affect: Mood normal.        Behavior: Behavior normal.     UC Treatments / Results  Labs (all labs ordered are listed, but only abnormal results are displayed) Labs Reviewed - No data to display  EKG   Radiology No results found.  Procedures Procedures (including critical care time)  Medications Ordered in UC Medications - No data to display  Initial Impression / Assessment and Plan / UC Course  I have reviewed the triage vital signs and the nursing notes.  Pertinent labs & imaging results that were available during my care of the patient were reviewed by me and considered in my medical decision making (see chart for details).  Acute neck pain Vertigo  1.  Prednisone 40 mg daily for 5 days 2.  Meloxicam 15 mg for 5 days then as needed after completion of steroid course 3.  Recommended pillows for support, heat in 15-minute intervals and daily stretching as tolerated 4.  Orthopedic follow-up recommended for persistent pain 5.  Meclizine 25 mg 3 times daily as needed, patient endorses that she has not had success with this medication in the past 6.  Recommended PCP follow-up for persistent dizziness Final Clinical Impressions(s) / UC Diagnoses   Final diagnoses:  None   Discharge Instructions   None    ED Prescriptions   None    PDMP not reviewed this encounter.   Valinda Hoar, NP 05/29/21 1500

## 2022-06-18 ENCOUNTER — Ambulatory Visit (HOSPITAL_COMMUNITY)
Admission: EM | Admit: 2022-06-18 | Discharge: 2022-06-18 | Disposition: A | Discharge status: Home / Self Care | Payer: Self-pay | Attending: Nurse Practitioner | Admitting: Nurse Practitioner

## 2022-06-18 ENCOUNTER — Encounter (HOSPITAL_COMMUNITY): Payer: Self-pay

## 2022-06-18 DIAGNOSIS — R509 Fever, unspecified: Secondary | ICD-10-CM

## 2022-06-18 DIAGNOSIS — Z1152 Encounter for screening for COVID-19: Secondary | ICD-10-CM | POA: Insufficient documentation

## 2022-06-18 DIAGNOSIS — J0110 Acute frontal sinusitis, unspecified: Secondary | ICD-10-CM | POA: Insufficient documentation

## 2022-06-18 DIAGNOSIS — R058 Other specified cough: Secondary | ICD-10-CM | POA: Insufficient documentation

## 2022-06-18 DIAGNOSIS — J209 Acute bronchitis, unspecified: Secondary | ICD-10-CM | POA: Insufficient documentation

## 2022-06-18 LAB — POC INFLUENZA A AND B ANTIGEN (URGENT CARE ONLY)
INFLUENZA A ANTIGEN, POC: NEGATIVE
INFLUENZA B ANTIGEN, POC: NEGATIVE

## 2022-06-18 MED ORDER — METHYLPREDNISOLONE 4 MG PO TBPK: ORAL_TABLET | ORAL | 0 refills | Status: DC

## 2022-06-18 MED ORDER — PROMETHAZINE-DM 6.25-15 MG/5ML PO SYRP: 10.0000 mL | ORAL_SOLUTION | Freq: Four times a day (QID) | ORAL | 0 refills | Status: DC | PRN

## 2022-06-18 MED ORDER — FLUTICASONE PROPIONATE 50 MCG/ACT NA SUSP
2.0000 | Freq: Every day | NASAL | 0 refills | Status: AC
Start: 2022-06-18 — End: ?

## 2022-06-18 MED ORDER — AMOXICILLIN 875 MG PO TABS: 875.0000 mg | ORAL_TABLET | Freq: Two times a day (BID) | ORAL | 0 refills | Status: AC

## 2022-06-18 NOTE — ED Triage Notes (Signed)
Pt is here for cough, sore throat, , headache  x1 month fever x 3days

## 2022-06-18 NOTE — ED Provider Notes (Signed)
[facility]    CSN: [id number] Arrival date & time: 06/18/22  1534      History   Chief Complaint No chief complaint on file.   HPI Cathy [name] is a 51 y.o. female.   Spanish Interpreter: Maureen Ralphs [phone]  Subjective:   Cathy [name] is a 51 y.o. female who presents for evaluation of symptoms of a URI. Symptoms include suspected fevers but not measured at home, chills, hot/cold spells, chest pain during cough, dry cough, headache, myalgias, and sore throat. Onset of symptoms was 1 month ago and has been unchanged since that time. No runny nose, ear pain, shortness of breath, nausea or vomiting. She is drinking plenty of fluids. She has tried tylenol and Nyquil without significant improvement in her symptoms. She denies any sick contacts.    The following portions of the patient's history were reviewed and updated as appropriate: allergies, current medications, past family history, past medical history, past social history, past surgical history, and problem list.      History reviewed. No pertinent past medical history.  Patient Active Problem List   Diagnosis Date Noted   Lower back injury 11/21/2016   Lumbar strain, initial encounter 11/21/2016   Vertigo 08/10/2015    Past Surgical History:  Procedure Laterality Date   CESAREAN SECTION     FootSurgery       OB History     Gravida  7   Para  5   Term  5   Preterm      AB  2   Living  5      SAB  2   IAB      Ectopic      Multiple      Live Births               Home Medications    Prior to Admission medications   Medication Sig Start Date End Date Taking? Authorizing Provider  amoxicillin (AMOXIL) 875 MG tablet Take 1 tablet (875 mg total) by mouth 2 (two) times daily for 7 days. 06/18/22 06/25/22 Yes Lurline Idol, FNP  fluticasone (FLONASE) 50 MCG/ACT nasal spray Place 2 sprays into both nostrils daily. 06/18/22  Yes Lurline Idol, FNP  methylPREDNISolone (MEDROL  DOSEPAK) 4 MG TBPK tablet Take as directed 06/18/22  Yes Lurline Idol, FNP  promethazine-dextromethorphan (PROMETHAZINE-DM) 6.25-15 MG/5ML syrup Take 10 mLs by mouth every 6 (six) hours as needed for cough. 06/18/22  Yes Lurline Idol, FNP    Family History Family History  Problem Relation Age of Onset   Diabetes Mother    Hyperlipidemia Mother     Social History Social History   Tobacco Use   Smoking status: Never   Smokeless tobacco: Never  Substance Use Topics   Alcohol use: No   Drug use: No     Allergies   Patient has no known allergies.   Review of Systems Review of Systems  Constitutional:  Positive for appetite change, chills and fever.  HENT:  Positive for congestion, sinus pressure and sore throat. Negative for ear pain.   Respiratory:  Positive for cough. Negative for shortness of breath and wheezing.   Gastrointestinal:  Negative for diarrhea, nausea and vomiting.  Musculoskeletal:  Positive for myalgias.  Neurological:  Positive for headaches.  All other systems reviewed and are negative.    Physical Exam Triage Vital Signs ED Triage Vitals  Enc Vitals Group     BP 06/18/22 1820 125/74  Pulse Rate 06/18/22 1820 74     Resp 06/18/22 1820 16     Temp 06/18/22 1820 98.3 F (36.8 C)     Temp Source 06/18/22 1820 Oral     SpO2 06/18/22 1820 98 %     Weight --      Height --      Head Circumference --      Peak Flow --      Pain Score 06/18/22 1817 8     Pain Loc --      Pain Edu? --      Excl. in [place]? --    No data found.  Updated Vital Signs BP 125/74 (BP Location: Right Arm)   Pulse 74   Temp 98.3 F (36.8 C) (Oral)   Resp 16   LMP 06/06/2022   SpO2 98%   Visual Acuity Right Eye Distance:   Left Eye Distance:   Bilateral Distance:    Right Eye Near:   Left Eye Near:    Bilateral Near:     Physical Exam Vitals reviewed.  Constitutional:      General: She is not in acute distress.    Appearance: Normal appearance. She  is not ill-appearing or toxic-appearing.  HENT:     Head: Normocephalic.     Right Ear: Tympanic membrane, ear canal and external ear normal.     Left Ear: Tympanic membrane, ear canal and external ear normal.     Nose: Congestion present.     Right Sinus: Frontal sinus tenderness present.     Left Sinus: Frontal sinus tenderness present.     Mouth/Throat:     Mouth: Mucous membranes are moist.     Pharynx: No oropharyngeal exudate or posterior oropharyngeal erythema.  Eyes:     Conjunctiva/sclera: Conjunctivae normal.     Pupils: Pupils are equal, round, and reactive to light.  Cardiovascular:     Rate and Rhythm: Normal rate and regular rhythm.  Pulmonary:     Effort: Pulmonary effort is normal.     Breath sounds: Normal breath sounds.  Musculoskeletal:        General: Normal range of motion.     Cervical back: Normal range of motion and neck supple.  Lymphadenopathy:     Cervical: No cervical adenopathy.  Skin:    General: Skin is warm and dry.  Neurological:     General: No focal deficit present.     Mental Status: She is alert and oriented to person, place, and time.      UC Treatments / Results  Labs (all labs ordered are listed, but only abnormal results are displayed) Labs Reviewed  SARS CORONAVIRUS 2 (TAT 6-24 HRS)  POC INFLUENZA A AND B ANTIGEN (URGENT CARE ONLY)    EKG   Radiology No results found.  Procedures Procedures (including critical care time)  Medications Ordered in UC Medications - No data to display  Initial Impression / Assessment and Plan / UC Course  I have reviewed the triage vital signs and the nursing notes.  Pertinent labs & imaging results that were available during my care of the patient were reviewed by me and considered in my medical decision making (see chart for details).    51 yo female that presents with a one-month history of suspected fevers but not measured at home, chills, hot/cold spells, chest pain during cough, dry  cough, headache, myalgias, and sore throat.  She is afebrile and nontoxic-appearing.  Plan:  Suggested symptomatic OTC  remedies. Nasal saline spray for congestion. Amoxicillin per orders. Medrol dose pack per orders  Flonase, promethazine DM  Follow up as needed.  Today's evaluation has revealed no signs of a dangerous process. Discussed diagnosis with patient and/or guardian. Patient and/or guardian aware of their diagnosis, possible red flag symptoms to watch out for and need for close follow up. Patient and/or guardian understands verbal and written discharge instructions. Patient and/or guardian comfortable with plan and disposition.  Patient and/or guardian has a clear mental status at this time, good insight into illness (after discussion and teaching) and has clear judgment to make decisions regarding their care  Documentation was completed with the aid of voice recognition software. Transcription may contain typographical errors.  Final Clinical Impressions(s) / UC Diagnoses   Final diagnoses:  Acute non-recurrent frontal sinusitis  Acute bronchitis, unspecified organism     Discharge Instructions      Take medications as prescribed. You may take tylenol or ibuprofen as needed for fevers/headache/body aches. Drink plenty of fluids. Go to the ED if your symptoms get worse.      ED Prescriptions     Medication Sig Dispense Auth. Provider   amoxicillin (AMOXIL) 875 MG tablet Take 1 tablet (875 mg total) by mouth 2 (two) times daily for 7 days. 14 tablet Tityana Pagan, [place], FNP   methylPREDNISolone (MEDROL DOSEPAK) 4 MG TBPK tablet Take as directed 21 tablet Rein Popov, FNP   fluticasone (FLONASE) 50 MCG/ACT nasal spray Place 2 sprays into both nostrils daily. 15.8 mL Enrique Sack, FNP   promethazine-dextromethorphan (PROMETHAZINE-DM) 6.25-15 MG/5ML syrup Take 10 mLs by mouth every 6 (six) hours as needed for cough. 118 mL Enrique Sack, FNP      PDMP not  reviewed this encounter.   Enrique Sack, [place] 06/18/22 1932

## 2022-06-18 NOTE — Discharge Instructions (Addendum)
Take medications as prescribed. You may take tylenol or ibuprofen as needed for fevers/headache/body aches. Drink plenty of fluids. Go to the ED if your symptoms get worse.

## 2022-06-19 LAB — SARS CORONAVIRUS 2 (TAT 6-24 HRS): SARS Coronavirus 2: NEGATIVE

## 2022-08-29 IMAGING — CR DG ANKLE COMPLETE 3+V*R*
3 series · 3 of 3 positions shown · non-contrast
Comparison: None.

CLINICAL DATA: Medial malleolus tenderness after fall

EXAM:
RIGHT ANKLE - COMPLETE 3+ VIEW

[x ankle ap right]
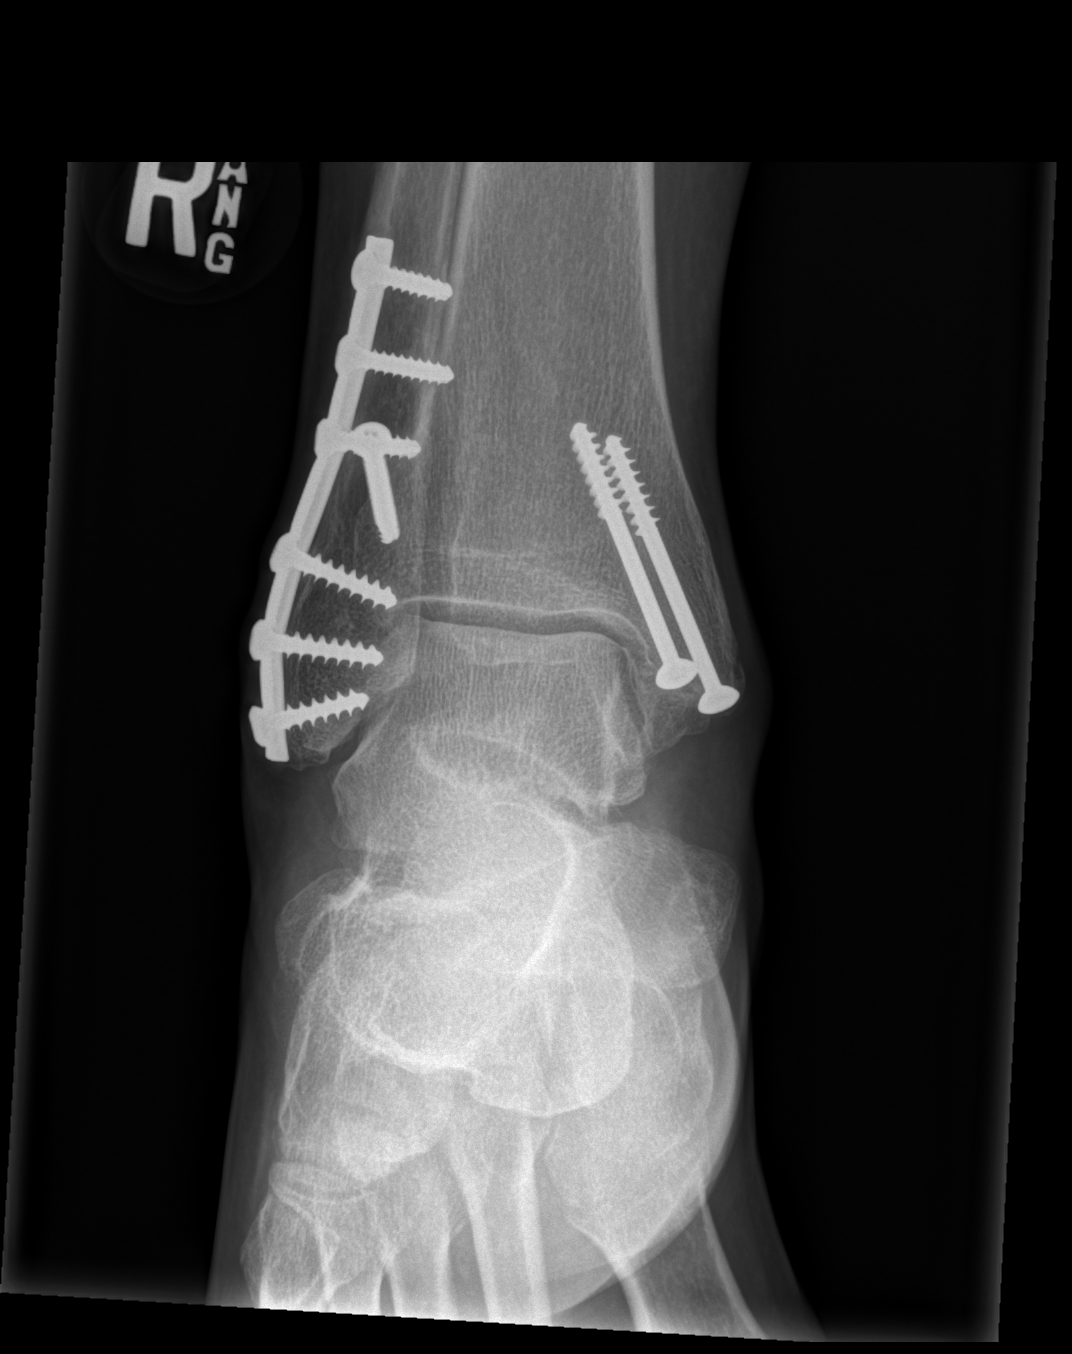

[x ankle lat right]
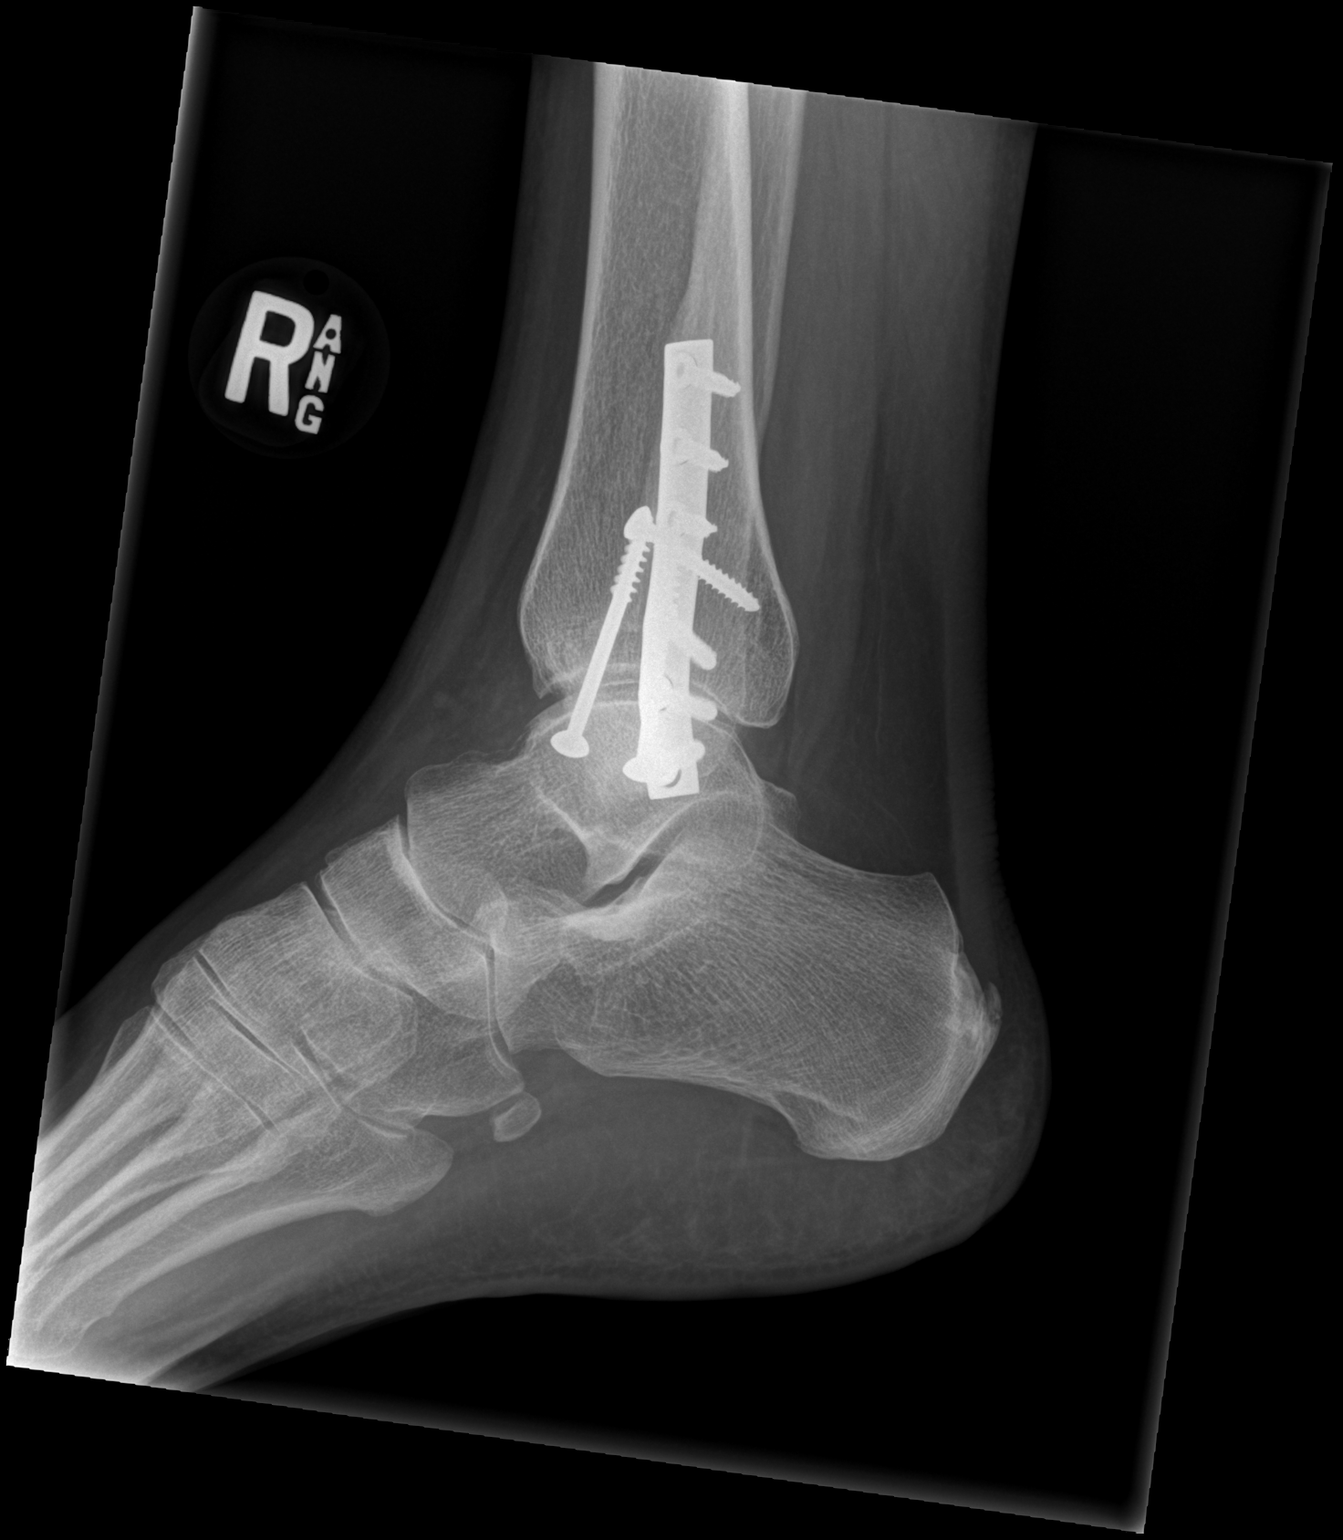

[x ankle obl right]
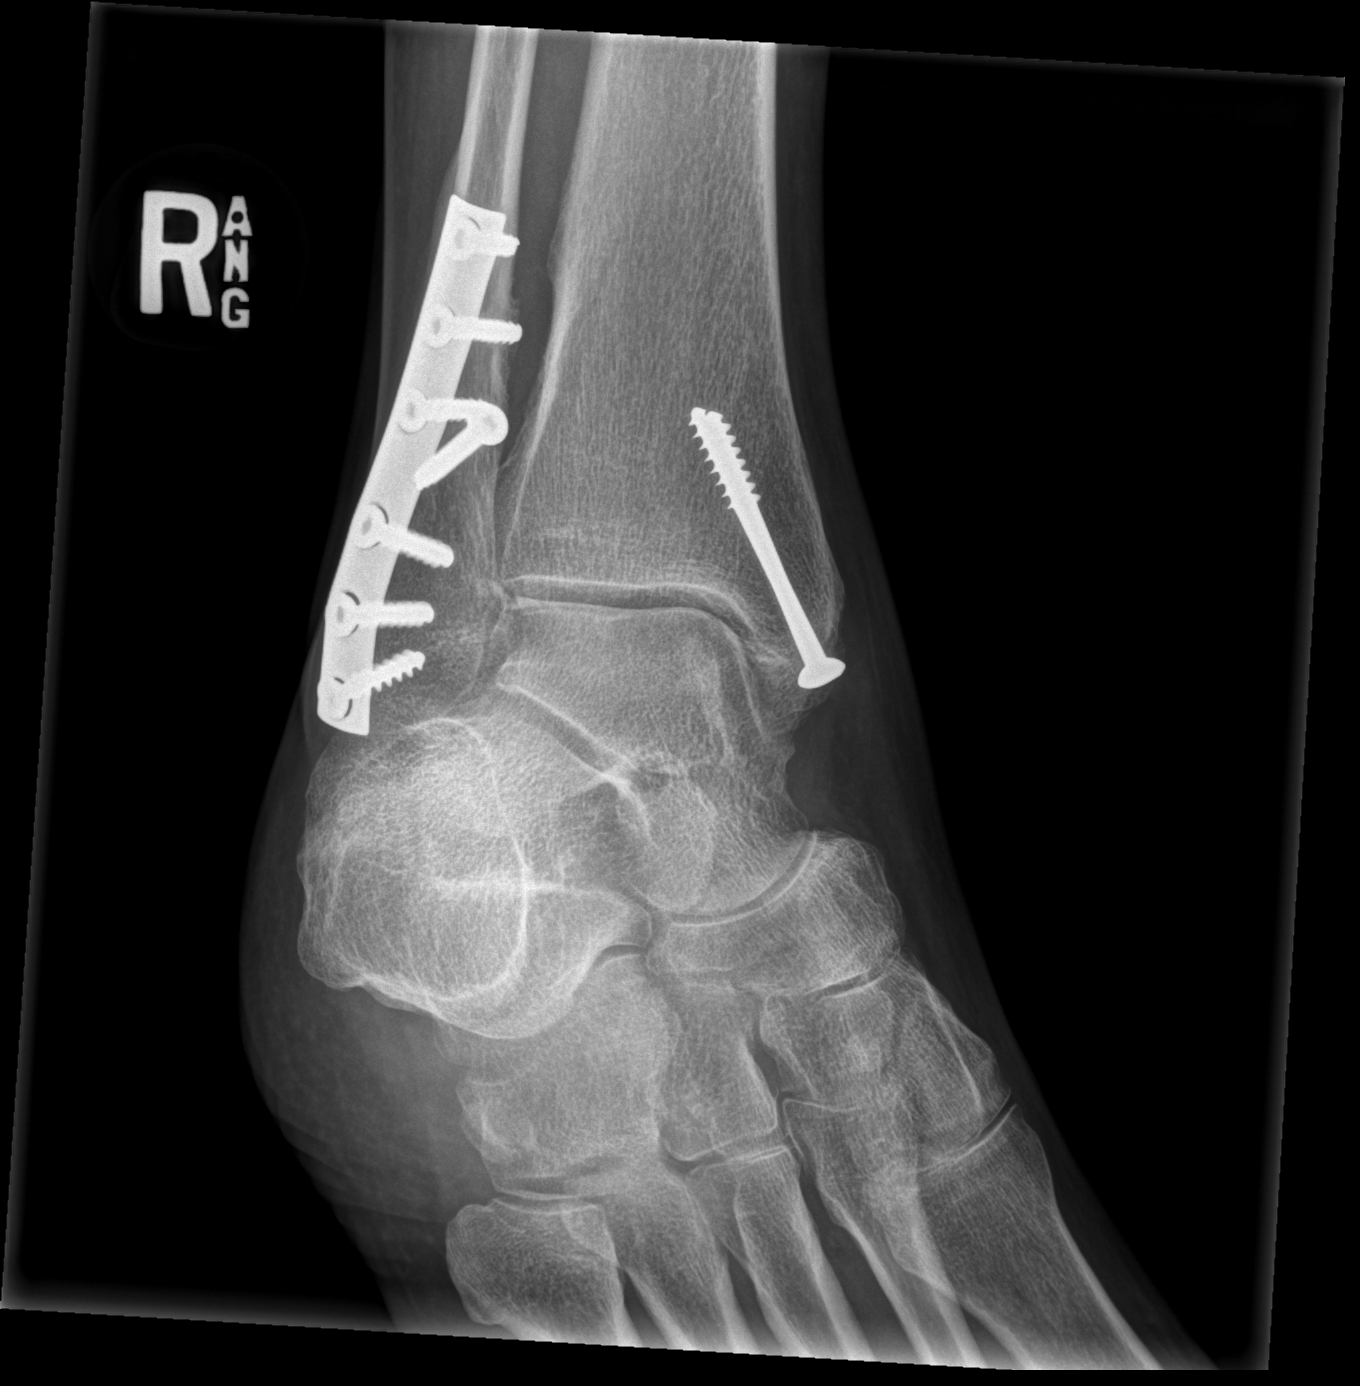

[3 of 3 positions shown; findings below may reference images not displayed]

FINDINGS: Medial and lateral malleolus hardware is intact without
periprosthetic fracture or lucency. No acute abnormality of the
right ankle. Soft tissues normal.
IMPRESSION: No acute abnormality of the right ankle.

## 2023-12-19 ENCOUNTER — Encounter (HOSPITAL_COMMUNITY): Payer: Self-pay

## 2023-12-19 ENCOUNTER — Ambulatory Visit (HOSPITAL_COMMUNITY)
Admission: EM | Admit: 2023-12-19 | Discharge: 2023-12-19 | Disposition: A | Discharge status: Home / Self Care | Payer: Self-pay | Attending: Family Medicine | Admitting: Family Medicine

## 2023-12-19 DIAGNOSIS — N3090 Cystitis, unspecified without hematuria: Secondary | ICD-10-CM | POA: Insufficient documentation

## 2023-12-19 LAB — POCT URINALYSIS DIP (MANUAL ENTRY)
Bilirubin, UA: NEGATIVE
Glucose, UA: NEGATIVE mg/dL
Ketones, POC UA: NEGATIVE mg/dL
Nitrite, UA: NEGATIVE
Protein Ur, POC: NEGATIVE mg/dL
Spec Grav, UA: 1.01 (ref 1.010–1.025)
Urobilinogen, UA: 0.2 U/dL
pH, UA: 6.5 (ref 5.0–8.0)

## 2023-12-19 MED ORDER — CEPHALEXIN 500 MG PO CAPS
500.0000 mg | ORAL_CAPSULE | Freq: Two times a day (BID) | ORAL | 0 refills | Status: AC
Start: 2023-12-19 — End: ?

## 2023-12-19 NOTE — ED Provider Notes (Signed)
  [facility]    ASSESSMENT & PLAN:  1. Cystitis    Begin: Meds ordered this encounter  Medications   cephALEXin (KEFLEX) 500 MG capsule    Sig: Take 1 capsule (500 mg total) by mouth 2 (two) times daily.    Dispense:  10 capsule    Refill:  0   No signs of pyelonephritis. Urine culture sent. Will notify patient of any significant results. Ensure proper hydration. Will follow up with her PCP or here if not showing improvement over the next 48 hours, sooner if needed.  Outlined signs and symptoms indicating need for more acute intervention. Patient verbalized understanding. After Visit Summary given.  SUBJECTIVE: Spanish ipad interpreter used. Cathy [name] is a 53 y.o. female who complains of 4 day h/o dysuria. Denies fever/n/v. No tx PTA. Denies abd pain. LMP: No LMP recorded. Patient is perimenopausal.  OBJECTIVE:  Vitals:   12/19/23 1026  BP: 131/82  Pulse: 65  Resp: 18  Temp: (!) 97.3 F (36.3 C)  TempSrc: Oral  SpO2: 97%   General appearance: alert; no distress HENT: oropharynx: moist Lungs: unlabored respirations Abdomen: soft Back: no CVA tenderness Extremities: no edema; symmetrical with no gross deformities Skin: warm and dry Neurologic: normal gait Psychological: alert and cooperative; normal mood and affect  Labs Reviewed  POCT URINALYSIS DIP (MANUAL ENTRY) - Abnormal; Notable for the following components:      Result Value   Clarity, UA cloudy (*)    Blood, UA small (*)    Leukocytes, UA Large (3+) (*)    All other components within normal limits  URINE CULTURE    No Known Allergies  History reviewed. No pertinent past medical history. Social History   Socioeconomic History   Marital status: Married    Spouse name: Not on file   Number of children: Not on file   Years of education: Not on file   Highest education level: Not on file  Occupational History   Not on file  Tobacco Use   Smoking status: Never   Smokeless  tobacco: Never  Substance and Sexual Activity   Alcohol use: No   Drug use: No   Sexual activity: Yes    Birth control/protection: None  Other Topics Concern   Not on file  Social History Narrative   Not on file   Social Drivers of Health   Financial Resource Strain: Not at Risk (06/07/2023)   Received from [employer]    Financial Resource Strain: 1  Food Insecurity: Not at Risk (06/07/2023)   Received from [facility] Insecurity    Food: 1  Transportation Needs: Not on File (05/22/2023)   Received from [facility] Needs    Transportation: 0  Physical Activity: Not on File (05/22/2023)   Received from [facility]   Physical Activity    Physical Activity: 0  Stress: Not on File (05/22/2023)   Received from [facility]   Stress    Stress: 0  Social Connections: Not on File (05/22/2023)   Received from [facility]   Social Connections    Connectedness: 0  Intimate Partner Violence: Not on file   Family History  Problem Relation Age of Onset   Diabetes Mother    Hyperlipidemia Mother         Afton Albright, MD 12/19/23 1515

## 2023-12-19 NOTE — ED Triage Notes (Signed)
 Patient presents with 4-day history of Dysuria.

## 2023-12-19 NOTE — Discharge Instructions (Signed)
 You have had labs (urine culture) sent today. We will call you with any significant abnormalities or if there is need to begin or change treatment or pursue further follow up.  You may also review your test results online through MyChart. If you do not have a MyChart account, instructions to sign up should be on your discharge paperwork.

## 2023-12-21 LAB — URINE CULTURE: Culture: >=100000 — AB

## 2023-12-23 ENCOUNTER — Ambulatory Visit (HOSPITAL_COMMUNITY): Payer: Self-pay

## 2023-12-30 ENCOUNTER — Encounter (HOSPITAL_COMMUNITY): Payer: Self-pay

## 2023-12-30 ENCOUNTER — Ambulatory Visit (HOSPITAL_COMMUNITY)
Admission: EM | Admit: 2023-12-30 | Discharge: 2023-12-30 | Disposition: A | Discharge status: Home / Self Care | Payer: Self-pay | Attending: Emergency Medicine | Admitting: Emergency Medicine

## 2023-12-30 DIAGNOSIS — N10 Acute tubulo-interstitial nephritis: Secondary | ICD-10-CM | POA: Insufficient documentation

## 2023-12-30 DIAGNOSIS — R109 Unspecified abdominal pain: Secondary | ICD-10-CM | POA: Insufficient documentation

## 2023-12-30 DIAGNOSIS — R300 Dysuria: Secondary | ICD-10-CM | POA: Insufficient documentation

## 2023-12-30 LAB — POCT URINALYSIS DIP (MANUAL ENTRY)
Bilirubin, UA: NEGATIVE
Glucose, UA: NEGATIVE mg/dL
Nitrite, UA: POSITIVE — AB
Protein Ur, POC: 30 mg/dL — AB
Spec Grav, UA: 1.02 (ref 1.010–1.025)
Urobilinogen, UA: 0.2 U/dL
pH, UA: 5.5 (ref 5.0–8.0)

## 2023-12-30 MED ORDER — CEFTRIAXONE SODIUM 500 MG IJ SOLR
500.0000 mg | INTRAMUSCULAR | Status: DC
  Administered 2023-12-30: 500 mg via INTRAMUSCULAR

## 2023-12-30 MED ORDER — LIDOCAINE HCL (PF) 1 % IJ SOLN
INTRAMUSCULAR | Status: AC
  Filled 2023-12-30: qty 2

## 2023-12-30 MED ORDER — CEFTRIAXONE SODIUM 500 MG IJ SOLR
INTRAMUSCULAR | Status: AC
  Filled 2023-12-30: qty 500

## 2023-12-30 MED ORDER — PHENAZOPYRIDINE HCL 100 MG PO TABS: 100.0000 mg | ORAL_TABLET | Freq: Three times a day (TID) | ORAL | 0 refills | Status: DC | PRN

## 2023-12-30 MED ORDER — CIPROFLOXACIN HCL 500 MG PO TABS: 500.0000 mg | ORAL_TABLET | Freq: Two times a day (BID) | ORAL | 0 refills | Status: AC

## 2023-12-30 NOTE — ED Provider Notes (Signed)
 [facility]    CSN: [id number] Arrival date & time: 12/30/23  1425      History   Chief Complaint Chief Complaint  Patient presents with   Urinary Tract Infection    HPI Cathy [name] is a 53 y.o. female.   Patient presents with dysuria, urinary frequency/urgency, lower abdominal pain, and left flank pain that began on 6/14.  Patient also endorses intermittent chills and feeling she had a fever yesterday and reports taking Tylenol for this.  Patient was recently seen and treated for a urinary tract infection on 6/5 with Keflex .  Patient states that she did finish this completely and then 2 or 3 days later began to have symptoms again.  At that time a urine culture was performed which revealed presence of E. coli.  Denies hematuria, abnormal vaginal discharge, abnormal vaginal bleeding, nausea, and vomiting.  The history is provided by the patient and medical records. The history is limited by a language barrier. A language interpreter was used (Spanish interpreter).  Urinary Tract Infection   History reviewed. No pertinent past medical history.  Patient Active Problem List   Diagnosis Date Noted   Lower back injury 11/21/2016   Lumbar strain, initial encounter 11/21/2016   Vertigo 08/10/2015    Past Surgical History:  Procedure Laterality Date   CESAREAN SECTION     FootSurgery       OB History     Gravida  7   Para  5   Term  5   Preterm      AB  2   Living  5      SAB  2   IAB      Ectopic      Multiple      Live Births               Home Medications    Prior to Admission medications   Medication Sig Start Date End Date Taking? Authorizing Provider  ciprofloxacin (CIPRO) 500 MG tablet Take 1 tablet (500 mg total) by mouth every 12 (twelve) hours for 5 days. 12/30/23 01/04/24 Yes Levora Reas A, NP  phenazopyridine (PYRIDIUM) 100 MG tablet Take 1 tablet (100 mg total) by mouth 3 (three) times daily as needed for pain.  12/30/23  Yes Karon Packer, NP    Family History Family History  Problem Relation Age of Onset   Diabetes Mother    Hyperlipidemia Mother     Social History Social History   Tobacco Use   Smoking status: Never   Smokeless tobacco: Never  Substance Use Topics   Alcohol use: No   Drug use: No     Allergies   Patient has no known allergies.   Review of Systems Review of Systems  Per HPI  Physical Exam Triage Vital Signs ED Triage Vitals [12/30/23 1606]  Encounter Vitals Group     BP 119/74     Girls Systolic BP Percentile      Girls Diastolic BP Percentile      Boys Systolic BP Percentile      Boys Diastolic BP Percentile      Pulse Rate 78     Resp 18     Temp 98.5 F (36.9 C)     Temp Source Oral     SpO2 95 %     Weight      Height      Head Circumference      Peak Flow  Pain Score      Pain Loc      Pain Education      Exclude from Growth Chart    No data found.  Updated Vital Signs BP 119/74 (BP Location: Left Arm)   Pulse 78   Temp 98.5 F (36.9 C) (Oral)   Resp 18   LMP 06/06/2022   SpO2 95%   Visual Acuity Right Eye Distance:   Left Eye Distance:   Bilateral Distance:    Right Eye Near:   Left Eye Near:    Bilateral Near:     Physical Exam Vitals and nursing note reviewed.  Constitutional:      General: She is awake. She is not in acute distress.    Appearance: Normal appearance. She is well-developed and well-groomed. She is not ill-appearing.  Abdominal:     Tenderness: There is abdominal tenderness in the suprapubic area. There is left CVA tenderness.   Skin:    General: Skin is warm and dry.   Neurological:     Mental Status: She is alert.   Psychiatric:        Behavior: Behavior is cooperative.      UC Treatments / Results  Labs (all labs ordered are listed, but only abnormal results are displayed) Labs Reviewed  POCT URINALYSIS DIP (MANUAL ENTRY) - Abnormal; Notable for the following components:       Result Value   Ketones, POC UA trace (5) (*)    Blood, UA moderate (*)    Protein Ur, POC =30 (*)    Nitrite, UA Positive (*)    Leukocytes, UA Small (1+) (*)    All other components within normal limits  URINE CULTURE    EKG   Radiology No results found.  Procedures Procedures (including critical care time)  Medications Ordered in UC Medications  cefTRIAXone (ROCEPHIN) injection 500 mg (has no administration in time range)    Initial Impression / Assessment and Plan / UC Course  I have reviewed the triage vital signs and the nursing notes.  Pertinent labs & imaging results that were available during my care of the patient were reviewed by me and considered in my medical decision making (see chart for details).     Patient is overall well-appearing.  Vitals are stable.  Upon assessment suprapubic and left CVA tenderness noted.  Urinalysis reveals positive nitrites and small leukocytes, as well as moderate amount of RBCs.  Will send culture.  Empirically treating for pyonephritis due to new onset of flank pain and subjective fever.  Given IM Rocephin in clinic.  Prescribed ciprofloxacin.  Prescribed Pyridium as needed for urinary pain and symptoms.  Discussed return and strict ER precautions. Final Clinical Impressions(s) / UC Diagnoses   Final diagnoses:  Dysuria  Flank pain  Acute pyelonephritis     Discharge Instructions      You received an injection of Rocephin in clinic today to cover for possible kidney infection. Start taking 1 tablet of ciprofloxacin every 12 hours for the next 5 days for additional kidney infection coverage. You can take Pyridium every 8 hours as needed for urinary pain and symptoms. Otherwise you can take 650 mg of Tylenol every 6-8 hours as needed for pain and fever. Make sure you are staying well-hydrated. Return here as needed. If you develop worsening back pain, persistent fever, excessive vomiting, or worsening symptoms please seek  immediate medical treatment in the emergency department.  Hoy recibi una inyeccin de Rocephin en la clnica  para cubrir una posible infeccin renal. Comience a tomar 1 comprimido de ciprofloxacino cada 12 horas durante los prximos 5 das para una cobertura adicional de la infeccin renal. Puede tomar Pyridium cada 8 horas segn sea necesario para el dolor y los sntomas urinarios. De lo contrario, puede tomar 650 mg de Tylenol cada 6-8 horas segn sea necesario para el dolor y Insurance account manager. Asegrese de mantenerse bien hidratado. Regrese aqu cuando lo necesite. Si presenta empeoramiento del dolor de espalda, fiebre persistente, vmitos excesivos o empeoramiento de los sntomas, busque atencin mdica inmediata en urgencias.    ED Prescriptions     Medication Sig Dispense Auth. Provider   ciprofloxacin (CIPRO) 500 MG tablet Take 1 tablet (500 mg total) by mouth every 12 (twelve) hours for 5 days. 10 tablet Levora Reas A, NP   phenazopyridine (PYRIDIUM) 100 MG tablet Take 1 tablet (100 mg total) by mouth 3 (three) times daily as needed for pain. 10 tablet Levora Reas A, NP      PDMP not reviewed this encounter.   Levora Reas A, NP 12/30/23 1710

## 2023-12-30 NOTE — ED Triage Notes (Signed)
 Per interpreter, pt was seen and tx'd for UTI 6/05 and sx's has returned. C/o pain on urination with urgency/frequency, and abdominal/back pain since Saturday. Denies taken any meds.

## 2023-12-30 NOTE — Discharge Instructions (Addendum)
 You received an injection of Rocephin in clinic today to cover for possible kidney infection. Start taking 1 tablet of ciprofloxacin every 12 hours for the next 5 days for additional kidney infection coverage. You can take Pyridium every 8 hours as needed for urinary pain and symptoms. Otherwise you can take 650 mg of Tylenol every 6-8 hours as needed for pain and fever. Make sure you are staying well-hydrated. Return here as needed. If you develop worsening back pain, persistent fever, excessive vomiting, or worsening symptoms please seek immediate medical treatment in the emergency department.  Hoy recibi una inyeccin de Rocephin en la clnica para cubrir una posible infeccin renal. Comience a tomar 1 comprimido de ciprofloxacino cada 12 horas durante los prximos 5 das para una cobertura adicional de la infeccin renal. Puede tomar Pyridium cada 8 horas segn sea necesario para el dolor y los sntomas urinarios. De lo contrario, puede tomar 650 mg de Tylenol cada 6-8 horas segn sea necesario para el dolor y Insurance account manager. Asegrese de mantenerse bien hidratado. Regrese aqu cuando lo necesite. Si presenta empeoramiento del dolor de espalda, fiebre persistente, vmitos excesivos o empeoramiento de los sntomas, busque atencin mdica inmediata en urgencias.

## 2024-01-02 ENCOUNTER — Ambulatory Visit (HOSPITAL_COMMUNITY): Payer: Self-pay

## 2024-01-02 LAB — URINE CULTURE: Culture: >=100000 — AB

## 2024-01-23 ENCOUNTER — Emergency Department (HOSPITAL_COMMUNITY): Payer: Self-pay

## 2024-01-23 ENCOUNTER — Other Ambulatory Visit: Payer: Self-pay

## 2024-01-23 ENCOUNTER — Emergency Department (HOSPITAL_COMMUNITY)
Admission: EM | Admit: 2024-01-23 | Discharge: 2024-01-23 | Disposition: A | Discharge status: Home / Self Care | Payer: Self-pay | Attending: Emergency Medicine | Admitting: Emergency Medicine

## 2024-01-23 ENCOUNTER — Encounter (HOSPITAL_COMMUNITY): Payer: Self-pay | Admitting: Emergency Medicine

## 2024-01-23 DIAGNOSIS — R102 Pelvic and perineal pain: Secondary | ICD-10-CM | POA: Insufficient documentation

## 2024-01-23 LAB — LIPASE, BLOOD: Lipase: 30 U/L (ref 11–51)

## 2024-01-23 LAB — URINALYSIS, ROUTINE W REFLEX MICROSCOPIC
Bacteria, UA: NONE SEEN
Bilirubin Urine: NEGATIVE
Glucose, UA: NEGATIVE mg/dL
Ketones, ur: NEGATIVE mg/dL
Leukocytes,Ua: NEGATIVE
Nitrite: NEGATIVE
Protein, ur: NEGATIVE mg/dL
Specific Gravity, Urine: 1.004 — ABNORMAL LOW (ref 1.005–1.030)
pH: 6 (ref 5.0–8.0)

## 2024-01-23 LAB — COMPREHENSIVE METABOLIC PANEL WITH GFR
ALT: 21 U/L (ref 0–44)
AST: 30 U/L (ref 15–41)
Albumin: 3.9 g/dL (ref 3.5–5.0)
Alkaline Phosphatase: 53 U/L (ref 38–126)
Anion gap: 10 (ref 5–15)
BUN: 14 mg/dL (ref 6–20)
CO2: 24 mmol/L (ref 22–32)
Calcium: 9.1 mg/dL (ref 8.9–10.3)
Chloride: 103 mmol/L (ref 98–111)
Creatinine, Ser: 0.91 mg/dL (ref 0.44–1.00)
GFR, Estimated: >60 mL/min (ref >60)
Glucose, Bld: 121 mg/dL — ABNORMAL HIGH (ref 70–99)
Potassium: 3.6 mmol/L (ref 3.5–5.1)
Sodium: 137 mmol/L (ref 135–145)
Total Bilirubin: 0.4 mg/dL (ref 0.0–1.2)
Total Protein: 7.5 g/dL (ref 6.5–8.1)

## 2024-01-23 LAB — CBC
HCT: 39.6 % (ref 36.0–46.0)
Hemoglobin: 13.1 g/dL (ref 12.0–15.0)
MCH: 30.1 pg (ref 26.0–34.0)
MCHC: 33.1 g/dL (ref 30.0–36.0)
MCV: 91 fL (ref 80.0–100.0)
Platelets: 221 K/uL (ref 150–400)
RBC: 4.35 MIL/uL (ref 3.87–5.11)
RDW: 12.1 % (ref 11.5–15.5)
WBC: 8.3 K/uL (ref 4.0–10.5)
nRBC: 0 % (ref 0.0–0.2)

## 2024-01-23 MED ORDER — PREMARIN 0.625 MG/GM VA CREA: 0.5000 | TOPICAL_CREAM | Freq: Every day | VAGINAL | 12 refills | Status: AC

## 2024-01-23 NOTE — ED Triage Notes (Signed)
 Pt reports lower abdominal pain and pressure x 3 weeks. Pt reports she has been treated with 2 different antibiotics for UTI with no relief.

## 2024-01-23 NOTE — ED Provider Notes (Signed)
 [place] EM[facility]GENCY DE[facility]MENT AT [facility] Provider Note   CSN: [id number] Arrival date & time: 01/23/24  1423     Patient presents with: Abdominal Pain   Cathy [name] is a 53 y.o. female.   HPI   53 year old Spanish-speaking female presents emergency department concern for lower pelvic pain, ongoing for a couple weeks.  States that she has been diagnosed with a UTI, took oral antibiotics however the discomfort returned a couple days ago.  She was instructed to come here for evaluation.  She admits to intermittent dysuria, pressure-like sensation and some flank pain on the left.  Otherwise she denies any vaginal bleeding, discharge, fever/chills.  At times is nauseous but denies any vomiting or diarrhea.  No history of kidney stones.  Interpreter services used.  Prior to Admission medications   Medication Sig Start Date End Date Taking? Authorizing Provider  phenazopyridine  (PYRIDIUM ) 100 MG tablet Take 1 tablet (100 mg total) by mouth 3 (three) times daily as needed for pain. 12/30/23   Johnie Rumaldo LABOR, NP    Allergies: Patient has no known allergies.    Review of Systems  Constitutional:  Negative for fever.  Respiratory:  Negative for shortness of breath.   Cardiovascular:  Negative for chest pain.  Gastrointestinal:  Negative for abdominal pain, diarrhea and vomiting.  Genitourinary:  Positive for dysuria, flank pain and pelvic pain. Negative for difficulty urinating, vaginal bleeding, vaginal discharge and vaginal pain.  Skin:  Negative for rash.  Neurological:  Negative for headaches.    Updated Vital Signs BP 131/86 (BP Location: Right Arm)   Pulse 78   Temp 98.3 F (36.8 C)   Resp 15   LMP 06/06/2022   SpO2 98%   Physical Exam Vitals and nursing note reviewed.  Constitutional:      General: She is not in acute distress.    Appearance: Normal appearance. She is not ill-appearing.  HENT:     Head: Normocephalic.     Mouth/Throat:     Mouth:  Mucous membranes are moist.  Cardiovascular:     Rate and Rhythm: Normal rate.  Pulmonary:     Effort: Pulmonary effort is normal. No respiratory distress.  Abdominal:     Palpations: Abdomen is soft.     Tenderness: There is no abdominal tenderness.  Skin:    General: Skin is warm.  Neurological:     Mental Status: She is alert and oriented to person, place, and time. Mental status is at baseline.  Psychiatric:        Mood and Affect: Mood normal.     (all labs ordered are listed, but only abnormal results are displayed) Labs Reviewed  COMPREHENSIVE METABOLIC PANEL WITH GFR - Abnormal; Notable for the following components:      Result Value   Glucose, Bld 121 (*)    All other components within normal limits  URINALYSIS, ROUTINE W REFLEX MICROSCOPIC - Abnormal; Notable for the following components:   Color, Urine STRAW (*)    Specific Gravity, Urine 1.004 (*)    Hgb urine dipstick SMALL (*)    All other components within normal limits  LIPASE, BLOOD  CBC    EKG: None  Radiology: CT Renal Stone Study Result Date: 01/23/2024 CLINICAL DATA:  Abdominal/flank pain, stone suspected EXAM: CT ABDOMEN AND PELVIS WITHOUT CONTRAST TECHNIQUE: Multidetector CT imaging of the abdomen and pelvis was performed following the standard protocol without IV contrast. RADIATION DOSE REDUCTION: This exam was performed according to  the departmental dose-optimization program which includes automated exposure control, adjustment of the mA and/or kV according to patient size and/or use of iterative reconstruction technique. COMPARISON:  None Available. FINDINGS: Lower chest: Mild dependent changes at the posterior lung bases. Mild linear scarring/atelectasis in the lingula. Hepatobiliary: No suspicious focal hepatic lesion identified within the limits of an unenhanced exam. Gallbladder is unremarkable. No biliary dilatation. Pancreas: Unremarkable. No pancreatic ductal dilatation or surrounding  inflammatory changes. Spleen: Normal in size without focal abnormality. Adrenals/Urinary Tract: Adrenal glands are unremarkable. No renal or ureteral calculi. No hydronephrosis. No suspicious focal lesion identified within the limits of an unenhanced exam. Bladder is unremarkable. Stomach/Bowel: Stomach is within normal limits. Appendix appears normal. Small bowel and colon are grossly unremarkable. No evidence of obstruction or focal inflammatory changes. Vascular/Lymphatic: Abdominal aorta is normal in caliber with trace scattered atherosclerotic calcification. No enlarged abdominal or pelvic lymph nodes. Reproductive: Anteverted uterus.  No adnexal masses. Other: No abdominopelvic ascites. No intraperitoneal free air. No abdominal wall hernia. Musculoskeletal: No acute osseous abnormality. No suspicious osseous lesion. IMPRESSION: No acute localizing findings in the abdomen or pelvis. No urolithiasis or hydronephrosis. Electronically Signed   By: Harrietta Sherry M.D.   On: 01/23/2024 16:46     Procedures   Medications Ordered in the ED - No data to display                                  Medical Decision Making Amount and/or Complexity of Data Reviewed Labs: ordered. Radiology: ordered.  Risk Prescription drug management.   53 year old female presents emergency department lower pelvic pain, dysuria, pressure.  History of UTI, recently treated with antibiotics.  Now concern for some flank pain.  She is complaining of mild nausea but no other systemic symptoms.  Blood work is reassuring.  Vitals are normal.  CT scan shows no acute finding.  Suspect the patient could be suffering from chronic interstitial cystitis.  On further conversation with the patient she states that sometimes it is the actual vaginal tissue that is uncomfortable.  Denies any vaginal bleeding or discharge, she doubts yeast infection at this time.  She is recently in menopause, this could be related to atrophy.  Will  trial topical vaginal estrogen and refer to urology/[facility].  Patient at this time appears safe and stable for discharge and close outpatient follow up. Discharge plan and strict return to ED precautions discussed, patient verbalizes understanding and agreement.     Final diagnoses:  None    ED Discharge Orders     None          Bari Roxie HERO, DO 01/23/24 2034

## 2024-01-23 NOTE — Discharge Instructions (Signed)
 Sus anlisis de [place], comoros y tomografa computarizada fueron normales. Es posible que tenga inflamacin de la vejiga o irritacin del tejido vaginal. Use la nueva crema segn lo prescrito. Consulte con urologa para recibir Agricultural engineer.

## 2024-04-13 ENCOUNTER — Encounter (HOSPITAL_COMMUNITY): Payer: Self-pay | Admitting: *Deleted

## 2024-04-13 ENCOUNTER — Other Ambulatory Visit: Payer: Self-pay

## 2024-04-13 ENCOUNTER — Ambulatory Visit (HOSPITAL_COMMUNITY)
Admission: EM | Admit: 2024-04-13 | Discharge: 2024-04-13 | Disposition: A | Discharge status: Home / Self Care | Attending: Family Medicine | Admitting: Family Medicine

## 2024-04-13 DIAGNOSIS — J069 Acute upper respiratory infection, unspecified: Secondary | ICD-10-CM | POA: Diagnosis not present

## 2024-04-13 LAB — POC COVID19/FLU A&B COMBO
Covid Antigen, POC: NEGATIVE
Influenza A Antigen, POC: NEGATIVE
Influenza B Antigen, POC: NEGATIVE

## 2024-04-13 MED ORDER — IBUPROFEN 600 MG PO TABS: 600.0000 mg | ORAL_TABLET | Freq: Three times a day (TID) | ORAL | 0 refills | Status: DC | PRN

## 2024-04-13 MED ORDER — BENZONATATE 100 MG PO CAPS: 100.0000 mg | ORAL_CAPSULE | Freq: Three times a day (TID) | ORAL | 0 refills | Status: DC | PRN

## 2024-04-13 NOTE — Discharge Instructions (Addendum)
 The tests for flu and COVID were negative  Take ibuprofen 600 mg--1 tab every 8 hours as needed for pain.  Take benzonatate  100 mg, 1 tab every 8 hours as needed for cough.  (Las pruebas de gripe y COVID dieron negativo.  Tome ibuprofeno 600 mg, 1 comprimido cada 8 horas segn sea necesario para el dolor.  Tome benzonatato 100 mg, 1 comprimido cada 8 horas segn sea necesario para la tos.)

## 2024-04-13 NOTE — ED Provider Notes (Addendum)
 [facility]    CSN: [id number] Arrival date & time: 04/13/24  9048      History   Chief Complaint Chief Complaint  Patient presents with   Cough   Headache    HPI Cathy [name] is a 53 y.o. female.    Cough Associated symptoms: headaches   Headache Associated symptoms: cough   Here for cough and nasal congestion and rhinorrhea and headache.  Symptoms began on September 26.  She has had some subjective fever but has not measured it.  She has also had some chills.  She has had a little nausea but no vomiting or diarrhea.  No history of asthma but she states she has had some bronchitis in the past.  NKDA  Last menstrual cycle was about 2 years ago    History reviewed. No pertinent past medical history.  Patient Active Problem List   Diagnosis Date Noted   Lower back injury 11/21/2016   Lumbar strain, initial encounter 11/21/2016   Vertigo 08/10/2015    Past Surgical History:  Procedure Laterality Date   CESAREAN SECTION     FootSurgery       OB History     Gravida  7   Para  5   Term  5   Preterm      AB  2   Living  5      SAB  2   IAB      Ectopic      Multiple      Live Births               Home Medications    Prior to Admission medications   Medication Sig Start Date End Date Taking? Authorizing Provider  benzonatate  (TESSALON ) 100 MG capsule Take 1 capsule (100 mg total) by mouth 3 (three) times daily as needed for cough. 04/13/24  Yes Neave Lenger K, MD  ibuprofen (ADVIL) 600 MG tablet Take 1 tablet (600 mg total) by mouth every 8 (eight) hours as needed (pain). 04/13/24  Yes Bee Marchiano K, MD  conjugated estrogens  (PREMARIN ) vaginal cream Place 0.5 Applicatorfuls vaginally daily. 01/23/24   Horton, Roxie HERO, DO    Family History Family History  Problem Relation Age of Onset   Diabetes Mother    Hyperlipidemia Mother     Social History Social History   Tobacco Use   Smoking status: Never    Smokeless tobacco: Never  Substance Use Topics   Alcohol use: No   Drug use: No     Allergies   Patient has no known allergies.   Review of Systems Review of Systems  Respiratory:  Positive for cough.   Neurological:  Positive for headaches.     Physical Exam Triage Vital Signs ED Triage Vitals  Encounter Vitals Group     BP 04/13/24 1025 112/76     Girls Systolic BP Percentile --      Girls Diastolic BP Percentile --      Boys Systolic BP Percentile --      Boys Diastolic BP Percentile --      Pulse Rate 04/13/24 1025 80     Resp 04/13/24 1025 20     Temp 04/13/24 1025 98.4 F (36.9 C)     Temp src --      SpO2 04/13/24 1025 94 %     Weight --      Height --      Head Circumference --  Peak Flow --      Pain Score 04/13/24 1019 6     Pain Loc --      Pain Education --      Exclude from Growth Chart --    No data found.  Updated Vital Signs BP 112/76   Pulse 80   Temp 98.4 F (36.9 C)   Resp 20   LMP 06/06/2022   SpO2 94%   Visual Acuity Right Eye Distance:   Left Eye Distance:   Bilateral Distance:    Right Eye Near:   Left Eye Near:    Bilateral Near:     Physical Exam Vitals reviewed.  Constitutional:      General: She is not in acute distress.    Appearance: She is not ill-appearing, toxic-appearing or diaphoretic.  HENT:     Right Ear: Tympanic membrane and ear canal normal.     Left Ear: Tympanic membrane and ear canal normal.     Nose: Congestion present.     Mouth/Throat:     Mouth: Mucous membranes are moist.     Comments: There is some clear mucus draining Eyes:     Extraocular Movements: Extraocular movements intact.     Conjunctiva/sclera: Conjunctivae normal.     Pupils: Pupils are equal, round, and reactive to light.  Cardiovascular:     Rate and Rhythm: Normal rate and regular rhythm.     Heart sounds: No murmur heard. Pulmonary:     Effort: Pulmonary effort is normal. No respiratory distress.     Breath sounds: No  stridor. No wheezing, rhonchi or rales.  Musculoskeletal:     Cervical back: Neck supple.  Lymphadenopathy:     Cervical: No cervical adenopathy.  Skin:    Capillary Refill: Capillary refill takes less than 2 seconds.     Coloration: Skin is not jaundiced or pale.  Neurological:     General: No focal deficit present.     Mental Status: She is alert and oriented to person, place, and time.  Psychiatric:        Behavior: Behavior normal.      UC Treatments / Results  Labs (all labs ordered are listed, but only abnormal results are displayed) Labs Reviewed  POC COVID19/FLU A&B COMBO    EKG   Radiology No results found.  Procedures Procedures (including critical care time)  Medications Ordered in UC Medications - No data to display  Initial Impression / Assessment and Plan / UC Course  I have reviewed the triage vital signs and the nursing notes.  Pertinent labs & imaging results that were available during my care of the patient were reviewed by me and considered in my medical decision making (see chart for details).     Visit is conducted in Spanish COVID and flu tests are negative  Ibuprofen is sent in for pain and Tessalon  Perles are sent in for cough Final Clinical Impressions(s) / UC Diagnoses   Final diagnoses:  Viral URI     Discharge Instructions      The tests for flu and COVID were negative  Take ibuprofen 600 mg--1 tab every 8 hours as needed for pain.  Take benzonatate  100 mg, 1 tab every 8 hours as needed for cough.  (Las pruebas de gripe y COVID dieron negativo.  Tome ibuprofeno 600 mg, 1 comprimido cada 8 horas segn sea necesario para el dolor.  Tome benzonatato 100 mg, 1 comprimido cada 8 horas segn sea necesario para la tos.)  ED Prescriptions     Medication Sig Dispense Auth. Provider   ibuprofen (ADVIL) 600 MG tablet Take 1 tablet (600 mg total) by mouth every 8 (eight) hours as needed (pain). 15 tablet Mellie Buccellato  K, MD   benzonatate  (TESSALON ) 100 MG capsule Take 1 capsule (100 mg total) by mouth 3 (three) times daily as needed for cough. 21 capsule Dontarius Sheley K, MD      PDMP not reviewed this encounter.   Vonna Sharlet POUR, MD 04/13/24 1126    Vonna Sharlet POUR, MD 04/13/24 [phone]

## 2024-04-13 NOTE — ED Triage Notes (Signed)
 PT reports she has a HA,cough for 4 days. NO home sick contacts. PT tried OTC for Sx's but with out relief.

## 2024-05-19 ENCOUNTER — Ambulatory Visit (INDEPENDENT_AMBULATORY_CARE_PROVIDER_SITE_OTHER)

## 2024-05-19 ENCOUNTER — Ambulatory Visit (HOSPITAL_COMMUNITY)
Admission: EM | Admit: 2024-05-19 | Discharge: 2024-05-19 | Disposition: A | Discharge status: Home / Self Care | Attending: Family Medicine | Admitting: Family Medicine

## 2024-05-19 ENCOUNTER — Other Ambulatory Visit: Payer: Self-pay

## 2024-05-19 ENCOUNTER — Encounter (HOSPITAL_COMMUNITY): Payer: Self-pay | Admitting: *Deleted

## 2024-05-19 DIAGNOSIS — R0789 Other chest pain: Secondary | ICD-10-CM | POA: Diagnosis not present

## 2024-05-19 DIAGNOSIS — R051 Acute cough: Secondary | ICD-10-CM

## 2024-05-19 DIAGNOSIS — R059 Cough, unspecified: Secondary | ICD-10-CM | POA: Diagnosis not present

## 2024-05-19 DIAGNOSIS — J4521 Mild intermittent asthma with (acute) exacerbation: Secondary | ICD-10-CM

## 2024-05-19 MED ORDER — ALBUTEROL SULFATE HFA 108 (90 BASE) MCG/ACT IN AERS
INHALATION_SPRAY | RESPIRATORY_TRACT | Status: AC
  Filled 2024-05-19: qty 6.7

## 2024-05-19 MED ORDER — ALBUTEROL SULFATE HFA 108 (90 BASE) MCG/ACT IN AERS: 2.0000 | INHALATION_SPRAY | RESPIRATORY_TRACT | 0 refills | Status: AC | PRN

## 2024-05-19 MED ORDER — PROMETHAZINE-DM 6.25-15 MG/5ML PO SYRP: 5.0000 mL | ORAL_SOLUTION | Freq: Four times a day (QID) | ORAL | 0 refills | Status: AC | PRN

## 2024-05-19 MED ORDER — DOXYCYCLINE HYCLATE 100 MG PO CAPS: 100.0000 mg | ORAL_CAPSULE | Freq: Two times a day (BID) | ORAL | 0 refills | Status: AC

## 2024-05-19 MED ORDER — ALBUTEROL SULFATE (2.5 MG/3ML) 0.083% IN NEBU
2.5000 mg | INHALATION_SOLUTION | Freq: Once | RESPIRATORY_TRACT | Status: AC
  Administered 2024-05-19: 2.5 mg via RESPIRATORY_TRACT

## 2024-05-19 MED ORDER — ALBUTEROL SULFATE (2.5 MG/3ML) 0.083% IN NEBU
INHALATION_SOLUTION | RESPIRATORY_TRACT | Status: AC
  Filled 2024-05-19: qty 3

## 2024-05-19 MED ORDER — PREDNISONE 20 MG PO TABS
40.0000 mg | ORAL_TABLET | Freq: Every day | ORAL | 0 refills | Status: AC
Start: 2024-05-19 — End: 2024-05-24

## 2024-05-19 NOTE — ED Triage Notes (Signed)
 PT reports having a cough for one month and was seen on 04-13-2024 for cough . Pt reports cough has not improved. Pt now reports redness and pain to RT eye for more than a month.

## 2024-05-19 NOTE — ED Provider Notes (Addendum)
 [facility]    CSN: [id number] Arrival date & time: 05/19/24  9157      History   Chief Complaint Chief Complaint  Patient presents with   Cough   Conjunctivitis    HPI Cathy [name] is a 53 y.o. female.   Here for cough and chest congestion and chest tightness.  This has been going on since right after I saw her on September 29 for upper respiratory symptoms.  She also has developed some redness around her right eyelid.  That also has been going on for about a month.  No fever recently  She does have a history of asthma but does not have an inhaler to use.  She he has felt tight in her chest and has been wheezing more than usual.  She has had some trouble with some pleuritic pain in her left lower ribs.  NKDA  Last menstrual cycle was 2 years ago.  She does not have a primary care provider  History reviewed. No pertinent past medical history.  Patient Active Problem List   Diagnosis Date Noted   Lower back injury 11/21/2016   Lumbar strain, initial encounter 11/21/2016   Vertigo 08/10/2015    Past Surgical History:  Procedure Laterality Date   CESAREAN SECTION     FootSurgery       OB History     Gravida  7   Para  5   Term  5   Preterm      AB  2   Living  5      SAB  2   IAB      Ectopic      Multiple      Live Births               Home Medications    Prior to Admission medications   Medication Sig Start Date End Date Taking? Authorizing Provider  albuterol (VENTOLIN HFA) 108 (90 Base) MCG/ACT inhaler Inhale 2 puffs into the lungs every 4 (four) hours as needed for wheezing or shortness of breath. 05/19/24  Yes Recie Cirrincione K, MD  doxycycline (VIBRAMYCIN) 100 MG capsule Take 1 capsule (100 mg total) by mouth 2 (two) times daily for 7 days. 05/19/24 05/26/24 Yes Vonna Sharlet POUR, MD  predniSONE  (DELTASONE ) 20 MG tablet Take 2 tablets (40 mg total) by mouth daily with breakfast for 5 days. 05/19/24 05/24/24 Yes  Vonna Sharlet POUR, MD  promethazine -dextromethorphan (PROMETHAZINE -DM) 6.25-15 MG/5ML syrup Take 5 mLs by mouth 4 (four) times daily as needed for cough. 05/19/24  Yes Vonna Sharlet POUR, MD  conjugated estrogens  (PREMARIN ) vaginal cream Place 0.5 Applicatorfuls vaginally daily. 01/23/24   Horton, Roxie HERO, DO    Family History Family History  Problem Relation Age of Onset   Diabetes Mother    Hyperlipidemia Mother     Social History Social History   Tobacco Use   Smoking status: Never   Smokeless tobacco: Never  Substance Use Topics   Alcohol use: No   Drug use: No     Allergies   Patient has no known allergies.   Review of Systems Review of Systems  Respiratory:  Positive for cough.      Physical Exam Triage Vital Signs ED Triage Vitals  Encounter Vitals Group     BP 05/19/24 1005 127/81     Girls Systolic BP Percentile --      Girls Diastolic BP Percentile --      Boys Systolic BP  Percentile --      Boys Diastolic BP Percentile --      Pulse Rate 05/19/24 1005 62     Resp 05/19/24 1005 20     Temp 05/19/24 1005 98.1 F (36.7 C)     Temp src --      SpO2 05/19/24 1005 94 %     Weight --      Height --      Head Circumference --      Peak Flow --      Pain Score 05/19/24 1003 4     Pain Loc --      Pain Education --      Exclude from Growth Chart --    No data found.  Updated Vital Signs BP 127/81   Pulse 62   Temp 98.1 F (36.7 C)   Resp 20   LMP 06/06/2022   SpO2 94%   Visual Acuity Right Eye Distance:   Left Eye Distance:   Bilateral Distance:    Right Eye Near:   Left Eye Near:    Bilateral Near:     Physical Exam Vitals reviewed.  Constitutional:      General: She is not in acute distress.    Appearance: She is not toxic-appearing.  HENT:     Right Ear: Tympanic membrane and ear canal normal.     Left Ear: Tympanic membrane and ear canal normal.     Nose: Nose normal.     Mouth/Throat:     Mouth: Mucous membranes are moist.      Comments: There is some clear mucus draining in the oropharynx. Eyes:     Extraocular Movements: Extraocular movements intact.     Conjunctiva/sclera: Conjunctivae normal.     Pupils: Pupils are equal, round, and reactive to light.  Cardiovascular:     Rate and Rhythm: Normal rate and regular rhythm.     Heart sounds: No murmur heard. Pulmonary:     Effort: No respiratory distress.     Breath sounds: No stridor. No rhonchi or rales.     Comments: There are bilateral expiratory wheezes that are low pitched.  Air movement is good. Musculoskeletal:     Cervical back: Neck supple.  Lymphadenopathy:     Cervical: No cervical adenopathy.  Skin:    Capillary Refill: Capillary refill takes less than 2 seconds.     Coloration: Skin is not jaundiced or pale.  Neurological:     General: No focal deficit present.     Mental Status: She is alert and oriented to person, place, and time.  Psychiatric:        Behavior: Behavior normal.      UC Treatments / Results  Labs (all labs ordered are listed, but only abnormal results are displayed) Labs Reviewed - No data to display  EKG   Radiology DG Chest 2 View Result Date: 05/19/2024 EXAM: 2 VIEW(S) XRAY OF THE CHEST 05/19/2024 11:52:06 AM COMPARISON: None available. CLINICAL HISTORY: cough, chest tightness x 1 month FINDINGS: LUNGS AND PLEURA: No focal pulmonary opacity. No pulmonary edema. No pleural effusion. No pneumothorax. HEART AND MEDIASTINUM: No acute abnormality of the cardiac and mediastinal silhouettes. BONES AND SOFT TISSUES: No acute osseous abnormality. IMPRESSION: 1. No acute cardiopulmonary process detected. Electronically signed by: Ryan Salvage MD 05/19/2024 12:57 PM EST RP Workstation: [id number]    Procedures Procedures (including critical care time)  Medications Ordered in UC Medications  albuterol (PROVENTIL) (2.5 MG/3ML) 0.083% nebulizer solution 2.5 mg (  2.5 mg Nebulization Given 05/19/24 1054)    Initial  Impression / Assessment and Plan / UC Course  I have reviewed the triage vital signs and the nursing notes.  Pertinent labs & imaging results that were available during my care of the patient were reviewed by me and considered in my medical decision making (see chart for details).     Chest x-ray by my review shows a possible developing right lower lobe infiltrate.  Albuterol treatment is given here.  Albuterol inhaler and prednisone  are sent into the pharmacy for asthma exacerbation and doxycycline is sent in for possible developing pneumonia.  The dermatitis on her eye I think will improve with the oral prednisone .  Staff will make her a primary care appointment Final Clinical Impressions(s) / UC Diagnoses   Final diagnoses:  Acute cough  Mild intermittent asthma with exacerbation     Discharge Instructions      By my review there may be a pneumonia developing in your right lower lung.  The radiologist will also read your x-ray, and if their interpretation differs significantly from mine, and the management of your condition would change, we will call you.  Albuterol inhaler--do 2 puffs every 4 hours as needed for shortness of breath or wheezing  Take prednisone  20 mg--2 daily for 5 days  Take doxycycline 100 mg --1 capsule 2 times daily for 7 days  Take Phenergan  with dextromethorphan syrup--5 mL or 1 teaspoon every 6 hours as needed for cough  Please follow-up with primary care about your asthma  (Segn mi evaluacin, podra estar desarrollndose una neumona en el lbulo inferior derecho de su pulmn.  El radilogo tambin revisar su radiografa y, si su interpretacin difiere significativamente de la ma y el tratamiento de su afeccin cambia, nos comunicaremos con usted.  Inhalador de albuterol: administre dos inhalaciones cada cuatro horas segn sea necesario para la dificultad para respirar o las sibilancias.  Tome prednisona 20 mg: dos cpsulas diarias  durante [address]  Tome doxiciclina 100 mg: [address] al da [facility].  Tome jarabe de Phenergan  con dextrometorfano: 5 ml o una cucharadita cada seis horas segn sea necesario para la tos.  Por favor, consulte con su mdico de cabecera sobre su asma.)     ED Prescriptions     Medication Sig Dispense Auth. Provider   albuterol (VENTOLIN HFA) 108 (90 Base) MCG/ACT inhaler Inhale 2 puffs into the lungs every 4 (four) hours as needed for wheezing or shortness of breath. 1 each Vonna Sharlet POUR, MD   predniSONE  (DELTASONE ) 20 MG tablet Take 2 tablets (40 mg total) by mouth daily with breakfast for 5 days. 10 tablet Gabriellah Rabel K, MD   doxycycline (VIBRAMYCIN) 100 MG capsule Take 1 capsule (100 mg total) by mouth 2 (two) times daily for 7 days. 14 capsule Ryn Peine K, MD   promethazine -dextromethorphan (PROMETHAZINE -DM) 6.25-15 MG/5ML syrup Take 5 mLs by mouth 4 (four) times daily as needed for cough. 118 mL Vonna Sharlet POUR, MD      PDMP not reviewed this encounter.   Vonna Sharlet POUR, MD 05/19/24 1224    Vonna Sharlet POUR, MD 05/19/24 [id number]

## 2024-05-19 NOTE — Discharge Instructions (Signed)
 By my review there may be a pneumonia developing in your right lower lung.  The radiologist will also read your x-ray, and if their interpretation differs significantly from mine, and the management of your condition would change, we will call you.  Albuterol inhaler--do 2 puffs every 4 hours as needed for shortness of breath or wheezing  Take prednisone  20 mg--2 daily for 5 days  Take doxycycline 100 mg --1 capsule 2 times daily for 7 days  Take Phenergan  with dextromethorphan syrup--5 mL or 1 teaspoon every 6 hours as needed for cough  Please follow-up with primary care about your asthma  (Segn mi evaluacin, podra estar desarrollndose una neumona en el lbulo inferior derecho de su pulmn.  El radilogo tambin revisar su radiografa y, si su interpretacin difiere significativamente de la ma y el tratamiento de su afeccin cambia, nos comunicaremos con usted.  Inhalador de albuterol: administre dos inhalaciones cada cuatro horas segn sea necesario para la dificultad para respirar o las sibilancias.  Tome prednisona 20 mg: dos cpsulas diarias durante [address]  Tome doxiciclina 100 mg: e. i. du pont veces al da [facility]  Tome jarabe de Phenergan  con dextrometorfano: 5 ml o una cucharadita cada seis horas segn sea necesario para la tos.  Por favor, consulte con su mdico de cabecera sobre su asma.)

## 2024-06-08 ENCOUNTER — Ambulatory Visit: Admitting: Family Medicine

## 2024-06-29 LAB — OPHTHALMOLOGY REPORT-SCANNED

## 2024-07-02 LAB — OPHTHALMOLOGY REPORT-SCANNED

## 2024-07-14 LAB — OPHTHALMOLOGY REPORT-SCANNED

## 2024-08-06 ENCOUNTER — Other Ambulatory Visit: Payer: Self-pay

## 2024-08-06 ENCOUNTER — Encounter: Payer: Self-pay | Admitting: Student

## 2024-08-06 ENCOUNTER — Ambulatory Visit: Payer: Self-pay | Admitting: Student

## 2024-08-06 VITALS — BP 118/61 | HR 64 | Temp 97.9°F | Ht 67.0 in | Wt 174.2 lb

## 2024-08-06 DIAGNOSIS — Z8669 Personal history of other diseases of the nervous system and sense organs: Secondary | ICD-10-CM

## 2024-08-06 DIAGNOSIS — H02842 Edema of right lower eyelid: Secondary | ICD-10-CM

## 2024-08-06 DIAGNOSIS — H1031 Unspecified acute conjunctivitis, right eye: Secondary | ICD-10-CM

## 2024-08-06 DIAGNOSIS — H109 Unspecified conjunctivitis: Secondary | ICD-10-CM | POA: Insufficient documentation

## 2024-08-06 DIAGNOSIS — H5711 Ocular pain, right eye: Secondary | ICD-10-CM

## 2024-08-06 DIAGNOSIS — H01002 Unspecified blepharitis right lower eyelid: Secondary | ICD-10-CM

## 2024-08-06 NOTE — Progress Notes (Signed)
 Internal Medicine Clinic Attending  I was physically present during the key portions of the resident provided service and participated in the medical decision making of patient's management care. I reviewed pertinent patient test results.  The assessment, diagnosis, and plan were formulated together and I agree with the documentation in the resident's note.  PERRL without any light sensitivity.  EOM fully intact with very minimal discomfort.   Mild-moderate peripheral conjunctival injection.   Available history and exam do not suggest a clear diagnosis.  I feel very unlikely to have acute orbital or periorbital infection or severe limbic inflammation.  Agree with urgent but not emergent ophthalmologic referral   Cadyn Fann, Layman CROME, MD

## 2024-08-06 NOTE — Progress Notes (Signed)
 "   Subjective:  CC: R eye redness and pain  HPI:  [name] is a 54 y.o. female with a past medical history stated below and presents today to establish care, however, patient has acute discomfort in the R eye and focused the visit on the same.   Patient has a history of R eye discomfort that dates back to November of 2025. At the time, there was lower lid swelling with redness, redness and discharge from right eye and loss of eye sight in the same for which she was seen at [facility] that same month. She was prescribed an eye steroid and an ointment for her lid that she used twice daily for a month and once daily there after. Her vision returned completely. She has had follow up appointments and has continued on daily steroid drops daily.   She experienced total resolution of all initial Nov 2025 symptoms, but the eye redness, discomfort with extraocular movement, eye discharge, and lower lid and periocular swelling with redness and  without pain has progressively getting worse for the past three weeks. She has been unable to return to [facility] as it has been cost prohibitive.  She denies loss of eye sight, was been able to wear her glasses without noticing discrepancies, no flashes of light, no headaches or tenderness around temples or periorbital area. No recent URI infections. No oral lesions. No animal handling.     History reviewed. No pertinent past medical history.  Medications Ordered Prior to Encounter[1]  Family History  Problem Relation Age of Onset   Diabetes Mother    Hyperlipidemia Mother     Social History   Socioeconomic History   Marital status: Married    Spouse name: Not on file   Number of children: Not on file   Years of education: Not on file   Highest education level: Not on file  Occupational History   Not on file  Tobacco Use   Smoking status: Never   Smokeless tobacco: Never  Substance and Sexual Activity   Alcohol use: No    Drug use: No   Sexual activity: Yes    Birth control/protection: None  Other Topics Concern   Not on file  Social History Narrative   Not on file   Social Drivers of Health   Tobacco Use: Low Risk (08/06/2024)   Patient History    Smoking Tobacco Use: Never    Smokeless Tobacco Use: Never    Passive Exposure: Not on file  Financial Resource Strain: Not at Risk (06/07/2023)   Received from [employer]    How hard is it for you to pay for the very basics like food, housing, heating, medical care, and medications?: 1  Food Insecurity: Not at Risk (06/07/2023)   Received from [facility] [employer] Insecurity    Within the past 12 months, you worried that your food would run out before you got money to buy more.: 1  Transportation Needs: Not on File (05/22/2023)   Received from [employer] Needs    Transportation: 0  Physical Activity: Not on File (05/22/2023)   Received from [facility]   Physical Activity    Physical Activity: 0  Stress: Not on File (05/22/2023)   Received from [facility]   Stress    Stress: 0  Social Connections: Not on File (05/22/2023)   Received from [employer]    Connectedness: 0  Intimate Partner Violence: Not on file  Depression (PHQ2-9): Low Risk (08/06/2024)   Depression (PHQ2-9)    PHQ-2 Score: 0  Alcohol Screen: Not on file  Housing: Not on file  Utilities: Not on file  Health Literacy: Not on file    Review of Systems: ROS negative except for what is noted on the assessment and plan.  Objective:   Vitals:   08/06/24 0955  BP: 118/61  Pulse: 64  Temp: 97.9 F (36.6 C)  TempSrc: Oral  SpO2: 100%  Weight: 174 lb 3.2 oz (79 kg)  Height: 5' 7 (1.702 m)    Physical Exam: Constitutional: well-appearing woman sitting in chair, in no acute distress HENT: normocephalic atraumatic, mucous membranes moist Eyes: R eye with erythematous conjunctiva in the periphery, not around iris or cornea. Crusting with  white discharge throughout. Erythema at lower lid margin. Edema with demarcated erythema on the lower periorbital area, lateral as well.   Full extraocular movement with pain and discomfort when moving eyes medially. Pupils equal and reactive to light and constricting consensually to light.  VA 20/25 OU with prescription glasses with hand chart. No cataracts or occlusion of pupil.  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 Concerning that patient has been on steroid (pink top) drops for over three months now. Without previous records, it is difficult to know what she has been treated for. Given current symptoms, suspect episcleritis of unknown cause vs anterior uveitis, keratoconjuctivitis. Given lack of over pain or tenderness periorbitally, orbital or periorbital cellulitis lower in my differential. No prior history of glaucoma and no intrinsic eye pain to suggest closed angle event.   However, patient would benefit of urgent referral to ophthalmology office for in-depth and detailed eye exam, sample collection, and follow up. She has [facility] card. Referral coordinator Chilon will attempt to schedule ASAP given prior history of progression to decreased visual acuity in the R eye.   Discussed precautionary symptoms to warrant immediate ED attention. For now, asked patient to discontinue  steroid drops and to continue ointment for lower lid inflammation, blepharitis not ruled out.     Return in about 4 weeks (around 09/03/2024) for chronic health condition screening as patient was not able to do so today with acute care appoint.  Patient seen with Dr. Jeanelle Hadassah Kristy [name], MD [facility]  08/06/2024, 11:26 AM    [1]  Current Outpatient Medications on File Prior to Visit  Medication Sig Dispense Refill   albuterol  (VENTOLIN  HFA) 108 (90 Base) MCG/ACT inhaler Inhale 2 puffs into the lungs every 4 (four) hours as needed for wheezing or shortness of breath. 1 each 0   conjugated estrogens  (PREMARIN ) vaginal cream Place 0.5 Applicatorfuls vaginally daily. 42.5 g 12   promethazine -dextromethorphan (PROMETHAZINE -DM) 6.25-15 MG/5ML syrup Take 5 mLs by mouth 4 (four) times daily as needed for cough. 118 mL 0   No  current facility-administered medications on file prior to visit.   "

## 2024-08-06 NOTE — Assessment & Plan Note (Signed)
 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 Concerning that patient has been on steroid (pink top) drops for over three months now. Without previous records, it is difficult to know what she has been treated for. Given current symptoms, suspect episcleritis of unknown cause vs anterior uveitis, keratoconjuctivitis. Given lack of over pain or tenderness periorbitally, orbital or periorbital cellulitis lower in my differential. No prior history of glaucoma and no intrinsic eye pain to suggest closed angle event.   However, patient would benefit of urgent referral to ophthalmology office for in-depth and detailed eye exam, sample collection, and follow up. She has [facility] card. Referral coordinator Chilon will attempt to schedule ASAP given prior history of progression to decreased visual acuity in the R eye.   Discussed precautionary symptoms to warrant immediate ED attention. For now, asked patient to discontinue steroid drops and to continue ointment for lower lid inflammation, blepharitis not ruled out.

## 2024-08-06 NOTE — Patient Instructions (Signed)
 [name] , gracias por permitirnos ayudarle hoy. Durante esta visita hemos hablado acerca de los siguientes asuntos medicos:   El dolor y rojo de su ojo y la inflamacion de su parpado bajo derecho: neecesitamos que se presente a una cita de oftalmologia - no optometria. Por que usted tiene la tarjeta naraja, nuestra cordinadora de remisiones esta trabajando en obtener sus datos medicos de [facility] y agendarle una cita con un doctor de ojos. Por favor mantenga su telefono cerca a usted porque ellos le llamaran para una cita.  Haga una cita de seguimiento con nosotros en un mes para hacerle examenes de seguimiento  PARE de [employer] gotas del tarrito con la tapa rosada. Continue echandose el unguento en el parpado.  Si siente que chief technology officer con el movimiento de los ojos [place], [place], si siente que esta perdiendo la vista en el ojo derecho, dirijase de ignacia vez a la sala de associate professor.  Para contactarnos: Esperamos verte la prxima vez. Llame a nuestra clnica al telefono: [phone] si tiene alguna pregunta o inquietud. El mejor horario para llamar es de lunes a viernes de 9 a. m. a 4 p. m. Si es fuera del horario de atencin o durante el fin de [place], llame al nmero principal del hospital y pregunte por el residente de guardia de medicina interna. Si necesita reposicin de medicamentos, por favor notifique a su farmacia con una semana de anticipacin y ellos nos enviarn una solicitud.  Gracias por confiar en nosotros para cuidar de usted!   [name], MD [facility]

## 2024-08-20 ENCOUNTER — Telehealth: Payer: Self-pay | Admitting: *Deleted

## 2024-08-20 NOTE — Telephone Encounter (Signed)
 Chilon, can you f/u on this referral?

## 2024-08-20 NOTE — Telephone Encounter (Signed)
 Copied from CRM [id number]. Topic: Referral - Status >> Aug 20, 2024  1:15 PM Miquel SAILOR wrote: Reason for CRM: (NEEDS INT SPANISH)PT daughter in law not on DPR requesting on referral info. Need call back for Auth status for app. Give this info for PT [phone]   [facility] clinic in [place], [place]  Address: [address] [place], [place], [place] [address] Phone: [phone]

## 2024-08-21 ENCOUNTER — Encounter (HOSPITAL_COMMUNITY): Payer: Self-pay

## 2024-08-21 ENCOUNTER — Other Ambulatory Visit: Payer: Self-pay

## 2024-08-21 ENCOUNTER — Emergency Department (HOSPITAL_COMMUNITY)
Admission: EM | Admit: 2024-08-21 | Discharge: 2024-08-21 | Disposition: A | Discharge status: Home / Self Care | Payer: Self-pay | Source: Home / Self Care | Attending: Emergency Medicine | Admitting: Emergency Medicine

## 2024-08-21 DIAGNOSIS — H539 Unspecified visual disturbance: Secondary | ICD-10-CM

## 2024-08-21 NOTE — ED Triage Notes (Signed)
 Patient reports stye's, eye swelling, drainage in the morning, and pain x 7 months. Patient has been seen and prescribed drops and erythromycin ointment for treatment but symptoms have not resolved, only improved but then returned. Patient has also seen eye dr and unsure what the dx was specifically.

## 2024-08-21 NOTE — ED Provider Notes (Signed)
 " [place] EMERGENCY DEPARTMENT AT [facility] Provider Note   CSN: [id number] Arrival date & time: 08/21/24  1052     Patient presents with: Eye Problem   Cathy [name] is a 54 y.o. female.   Pt is a 54 yo female with no significant pmhx.  Pt said she had conjunctivitis in November it was associated with some vision changes.  She did go to an optometrist [facility]) and was prescribed neomycin and polymyxin B + dexamethasone eye drops as well as erythromycin eye ointment.  She has been using these meds since then and sx are not improving.  She describes the vision changes as blurry in the center of her eye.  She did see her PCP on 1/22 and was referred to ophthalmology.  Pt said they can't get an appointment until May and she is worried about her vision.       Prior to Admission medications  Medication Sig Start Date End Date Taking? Authorizing Provider  albuterol  (VENTOLIN  HFA) 108 (90 Base) MCG/ACT inhaler Inhale 2 puffs into the lungs every 4 (four) hours as needed for wheezing or shortness of breath. 05/19/24   Vonna Sharlet POUR, MD  conjugated estrogens  (PREMARIN ) vaginal cream Place 0.5 Applicatorfuls vaginally daily. 01/23/24   Horton, Roxie HERO, DO  promethazine -dextromethorphan (PROMETHAZINE -DM) 6.25-15 MG/5ML syrup Take 5 mLs by mouth 4 (four) times daily as needed for cough. 05/19/24   Banister, Pamela K, MD    Allergies: Patient has no known allergies.    Review of Systems  Musculoskeletal:        Right eye blurry vision  All other systems reviewed and are negative.   Updated Vital Signs BP (!) 140/79 (BP Location: Right Arm)   Pulse 67   Temp 97.8 F (36.6 C)   Resp 14   Ht 5' 7 (1.702 m)   Wt 79 kg   LMP 06/06/2022   SpO2 96%   BMI 27.28 kg/m   Physical Exam Vitals and nursing note reviewed.  Constitutional:      Appearance: Normal appearance.  HENT:     Head: Normocephalic and atraumatic.     Right Ear: External ear normal.      Left Ear: External ear normal.     Nose: Nose normal.     Mouth/Throat:     Mouth: Mucous membranes are moist.     Pharynx: Oropharynx is clear.  Eyes:     Extraocular Movements: Extraocular movements intact.     Conjunctiva/sclera: Conjunctivae normal.     Pupils: Pupils are equal, round, and reactive to light.     Comments: Redness to right lower eyelid  Cardiovascular:     Rate and Rhythm: Normal rate and regular rhythm.     Pulses: Normal pulses.     Heart sounds: Normal heart sounds.  Pulmonary:     Effort: Pulmonary effort is normal.     Breath sounds: Normal breath sounds.  Abdominal:     General: Abdomen is flat. Bowel sounds are normal.     Palpations: Abdomen is soft.  Musculoskeletal:        General: Normal range of motion.     Cervical back: Normal range of motion and neck supple.  Skin:    Capillary Refill: Capillary refill takes less than 2 seconds.  Neurological:     General: No focal deficit present.     Mental Status: She is alert and oriented to person, place, and time.  Psychiatric:  Mood and Affect: Mood normal.        Behavior: Behavior normal.     (all labs ordered are listed, but only abnormal results are displayed) Labs Reviewed - No data to display  EKG: None  Radiology: No results found.   Procedures   Medications Ordered in the ED - No data to display                                  Medical Decision Making  This patient presents to the ED for concern of r eye problems, this involves an extensive number of treatment options, and is a complaint that carries with it a high risk of complications and morbidity.  The differential diagnosis includes glaucoma, infection, dry eye   Co morbidities that complicate the patient evaluation  none   Additional history obtained:  Additional history obtained from epic chart review External records from outside source obtained and reviewed including family   Medicines ordered and  prescription drug management:   I have reviewed the patients home medicines and have made adjustments as needed  Consultations Obtained:  I requested consultation with the ophthalmologist (Dr. Nonah),  and discussed lab and imaging findings as well as pertinent plan - as this is not an acute condition, he recommends stopping the dexamethasone drops and calling to different places to see when they can get her in sooner.   Problem List / ED Course:  R eye vision disturbance:  not acute.  Pt to f/u with outpatient ophthalmology.   Reevaluation:  After the interventions noted above, I reevaluated the patient and found that they have :stayed the same   Social Determinants of Health:  Spanish speaker   Dispostion:  After consideration of the diagnostic results and the patients response to treatment, I feel that the patent would benefit from discharge with outpatient f/u.       Final diagnoses:  Vision disturbance    ED Discharge Orders     None          Dean Clarity, MD 08/21/24 1338  "

## 2024-08-21 NOTE — Discharge Instructions (Addendum)
 Stop the eye drops.   [facility]:  [phone]

## 2024-08-21 NOTE — Telephone Encounter (Signed)
Thanks Chilon! 

## 2024-09-07 ENCOUNTER — Ambulatory Visit: Payer: Self-pay | Admitting: Student
